# Patient Record
Sex: Female | Born: 1995
Health system: Southern US, Community
[De-identification: ages and names within clinical notes are randomized; demographics above are authoritative.]

## PROBLEM LIST (undated history)

## (undated) DIAGNOSIS — R519 Headache, unspecified: Secondary | ICD-10-CM

## (undated) HISTORY — PX: NO PAST SURGERIES: SHX2092

## (undated) HISTORY — DX: Headache, unspecified: R51.9

---
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
## 2014-01-04 ENCOUNTER — Encounter: Payer: Self-pay | Admitting: Internal Medicine

## 2014-01-04 NOTE — Progress Notes (Signed)
   Subjective:    Patient ID: Cheyenne Beck, female    DOB: [birth year], 18 y.o.   MRN: [id number]  HPI 18 year old female who presents to establish care.  For patient of Dr Tracey Harries.  She has been doing well.  In college.  Wants to be a physical therapist.  Stays active.  Plays softball.  No cardiac symptoms with increased activity or exertion.  No sob or cough.  No problems with allergies.  No nausea or vomiting.  No bowel issues.  No urinary or vaginal issues.  Not currently sexually active.  Last menstrual period 12/15/13.  Menarche in 7th - 8th grade.  Regular periods.  Has never been pregnant.  Overall feels good.     Past Surgical History  Procedure Laterality Date  . No past surgeries      Review of Systems Patient denies any headache, lightheadedness or dizziness.  No sinus or allergy symptoms.  No chest pain, tightness or palpitations.  No increased shortness of breath, cough or congestion.  No nausea or vomiting.  No acid reflux.  No abdominal pain or cramping.  No bowel change, such as diarrhea, constipation, BRBPR or melana.  No urine change.  Stays active.  Overall feels good.        Objective:   Physical Exam Filed Vitals:   12/31/13 0911  BP: 110/80  Pulse: 72  Temp: 98.1 F (77.5 C)   18 year old female in no acute distress.   HEENT:  Nares- clear.  Oropharynx - without lesions. NECK:  Supple.  Nontender.  No audible bruit.  HEART:  Appears to be regular. LUNGS:  No crackles or wheezing audible.  Respirations even and unlabored.  RADIAL PULSE:  Equal bilaterally.   ABDOMEN:  Soft, nontender.  Bowel sounds present and normal.  No audible abdominal bruit.  EXTREMITIES:  No increased edema present.  DP pulses palpable and equal bilaterally.          Assessment & Plan:  HEALTH MAINTENANCE.  Will schedule her for a yearly physical.  Hold on pap today.  Discussed the importance of wearing seatbelts and using condoms to prevent against sexually transmitted disease.     I  spent 30 minutes with the patient and more than 50% of the time was spent in consultation regarding the above.

## 2015-01-08 ENCOUNTER — Ambulatory Visit (INDEPENDENT_AMBULATORY_CARE_PROVIDER_SITE_OTHER): Payer: Managed Care, Other (non HMO) | Admitting: Internal Medicine

## 2015-01-08 ENCOUNTER — Encounter: Payer: Self-pay | Admitting: Internal Medicine

## 2015-01-08 VITALS — BP 110/70 | HR 74 | Temp 97.9°F | Ht 67.0 in | Wt 195.1 lb

## 2015-01-08 DIAGNOSIS — Z0000 Encounter for general adult medical examination without abnormal findings: Secondary | ICD-10-CM

## 2015-01-08 DIAGNOSIS — N63 Unspecified lump in unspecified breast: Secondary | ICD-10-CM

## 2015-01-08 HISTORY — DX: Unspecified lump in unspecified breast: N63.0

## 2015-01-08 NOTE — Progress Notes (Signed)
Patient ID: Cheyenne Beck, female   DOB: 08/18/1995, 19 y.o.   MRN: [id number]   Subjective:    Patient ID: Cheyenne Beck, female    DOB: 03/25/1996, 19 y.o.   MRN: [id number]  HPI  Patient here for a physical exam.  Doing well.  Stays active.  No cardiac symptoms with increased activity or exertion.  Breathing stable.  Eating and drinking well.  LMP 12/25/14.  Bowels stable.     Review of Systems  Constitutional: Negative for appetite change and unexpected weight change.  HENT: Negative for congestion and sinus pressure.   Eyes: Negative for pain and visual disturbance.  Respiratory: Negative for cough, chest tightness and shortness of breath.   Cardiovascular: Negative for chest pain, palpitations and leg swelling.  Gastrointestinal: Negative for nausea, vomiting, abdominal pain and diarrhea.  Genitourinary: Negative for dysuria and difficulty urinating.  Musculoskeletal: Negative for back pain and joint swelling.  Skin: Negative for color change and rash.  Neurological: Negative for dizziness, light-headedness and headaches.  Hematological: Negative for adenopathy. Does not bruise/bleed easily.  Psychiatric/Behavioral: Negative for dysphoric mood and agitation.       Objective:     Blood pressure recheck:  112/68  Physical Exam  Constitutional: She appears well-developed and well-nourished. No distress.  HENT:  Nose: Nose normal.  Mouth/Throat: Oropharynx is clear and moist.  Eyes: Right eye exhibits no discharge. Left eye exhibits no discharge. No scleral icterus.  Neck: Neck supple. No thyromegaly present.  Cardiovascular: Normal rate and regular rhythm.   Pulmonary/Chest: Breath sounds normal. No respiratory distress. She has no wheezes.  Breast exam reveals no nipple discharge or nipple discharged present.  Palpable nodule 11-12:00 left breast.  No other palpable nodules appreciated.  No axillary adenopathy.    Abdominal: Soft. Bowel sounds are normal. There is no  tenderness.  Musculoskeletal: She exhibits no edema or tenderness.  Lymphadenopathy:    She has no cervical adenopathy.  Skin: No rash noted. No erythema.  Psychiatric: She has a normal mood and affect. Her behavior is normal.    BP 110/70 mmHg  Pulse 74  Temp(Src) 97.9 F (36.6 C) (Oral)  Ht 5\' 7"  (1.702 m)  Wt 195 lb 2 oz (88.508 kg)  BMI 30.55 kg/m2  SpO2 98%  LMP 12/25/2014 (Approximate) Wt Readings from Last 3 Encounters:  01/08/15 195 lb 2 oz (88.508 kg)  12/31/13 198 lb 8 oz (90.039 kg) (97 %*, Z = 1.93)   * Growth percentiles are based on CDC 2-20 Years data.       Assessment & Plan:   Problem List Items Addressed This Visit    Breast nodule - Primary    Nodule as outlined.  Schedule left breast ultrasound.        Relevant Orders   US BREAST COMPLETE UNI LEFT INC AXILLA   Health care maintenance    Physical today 01/08/15.  Check breast ultrasound.            Dale DurhamSCOTT, Fed Ceci, MD

## 2015-01-08 NOTE — Progress Notes (Signed)
Pre visit review using our clinic review tool, if applicable. No additional management support is needed unless otherwise documented below in the visit note. 

## 2015-01-12 ENCOUNTER — Encounter: Payer: Self-pay | Admitting: Internal Medicine

## 2015-01-12 DIAGNOSIS — Z0000 Encounter for general adult medical examination without abnormal findings: Secondary | ICD-10-CM | POA: Insufficient documentation

## 2015-01-12 NOTE — Assessment & Plan Note (Signed)
Nodule as outlined.  Schedule left breast ultrasound.

## 2015-01-12 NOTE — Assessment & Plan Note (Signed)
Physical today 01/08/15.  Check breast ultrasound.

## 2015-01-16 ENCOUNTER — Other Ambulatory Visit: Payer: Self-pay | Admitting: Internal Medicine

## 2015-01-16 ENCOUNTER — Ambulatory Visit
Admission: RE | Admit: 2015-01-16 | Discharge: 2015-01-16 | Disposition: A | Discharge status: Home / Self Care | Payer: Managed Care, Other (non HMO) | Source: Ambulatory Visit | Attending: Internal Medicine | Admitting: Internal Medicine

## 2015-01-16 DIAGNOSIS — N63 Unspecified lump in unspecified breast: Secondary | ICD-10-CM

## 2015-01-20 ENCOUNTER — Telehealth: Payer: Self-pay | Admitting: Internal Medicine

## 2015-01-20 NOTE — Telephone Encounter (Signed)
Pt had breast ultrasound Friday.  Her mother sent me a message asking about a biopsy that radiology recommended.  I have not seen a copy of the ultrasound.  Please see if we can get from hospital. Not showing up in our system.   Thanks

## 2015-01-21 NOTE — Telephone Encounter (Signed)
Found report under "images". Printed & placed in green folder

## 2015-01-21 NOTE — Telephone Encounter (Signed)
Cheyenne Beck is away on a cruise.  Her mother sent me a my chart message asking about the biospy and if she could schedule the appt.  I have no problem with her scheduling the appt, but I would prefer to refer her to Dr Lemar Livings and let him evaluate and do biopsy if needed.  If agreeable, let me know and I will place the order for the referral.  Her mother's name is Cheyenne Beck.  I left her a message at her home and on her cell phone.

## 2015-01-22 ENCOUNTER — Other Ambulatory Visit: Payer: Self-pay | Admitting: Internal Medicine

## 2015-01-22 DIAGNOSIS — N63 Unspecified lump in unspecified breast: Secondary | ICD-10-CM

## 2015-01-22 NOTE — Telephone Encounter (Signed)
Pt's mother notified & will contact Dr. Lemar Livings office herself to schedule. Copy of Ultrasound & last office note sent electronically also.

## 2015-01-22 NOTE — Progress Notes (Unsigned)
Order placed for surgery referral.  

## 2015-01-26 ENCOUNTER — Telehealth: Payer: Self-pay | Admitting: Internal Medicine

## 2015-01-26 DIAGNOSIS — R928 Other abnormal and inconclusive findings on diagnostic imaging of breast: Secondary | ICD-10-CM

## 2015-01-26 NOTE — Telephone Encounter (Signed)
Order placed for referral to surgery for abnormal breast ultrasound.

## 2015-03-09 ENCOUNTER — Ambulatory Visit: Payer: Self-pay | Admitting: General Surgery

## 2015-09-09 IMAGING — US US BREAST COMPLETE UNI LEFT INC AXILLA
1 series · 1 of 1 positions shown · non-contrast
Comparison: None

CLINICAL DATA: Patient presents for evaluation of palpable
abnormality within the left breast upper inner quadrant anterior
depth.

EXAM:
ULTRASOUND OF THE LEFT BREAST

[Series 1: us breast complete uni left inc axilla · 0.08mm/px · 1 of 1 slices shown]
[im 1/1]
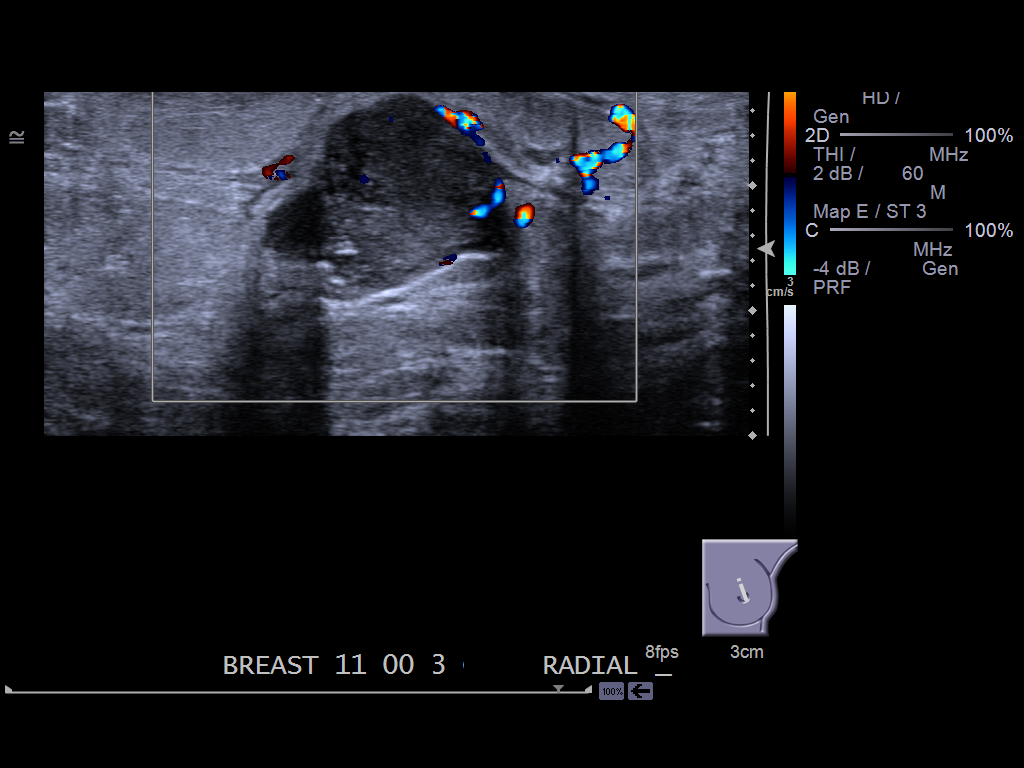

[1 of 1 positions shown; findings below may reference images not displayed]

FINDINGS: On physical exam, I palpate a small mobile mass within the 11
o'clock position left breast anterior depth.

Targeted ultrasound is performed, showing a 2.0 x 2.1 x 1.5 cm
lobular and mildly irregular hypoechoic mass with the left breast 11
o'clock position 3 cm from the nipple corresponding with palpable
abnormality. No left axillary lymphadenopathy.
IMPRESSION: Palpable left breast mass, while favored to represent a
fibroadenoma, has somewhat irregular margins and lobular contour;
therefore ultrasound-guided core needle biopsy is recommended for
definitive diagnosis.

RECOMMENDATION:
Ultrasound-guided core needle biopsy left breast mass for definitive
diagnosis.

This will be scheduled at patient convenience.

I have discussed the findings and recommendations with the patient.
Results were also provided in writing at the conclusion of the
visit. If applicable, a reminder letter will be sent to the patient
regarding the next appointment.

BI-RADS CATEGORY  4: Suspicious.

## 2016-01-11 ENCOUNTER — Ambulatory Visit (INDEPENDENT_AMBULATORY_CARE_PROVIDER_SITE_OTHER): Payer: Managed Care, Other (non HMO) | Admitting: Internal Medicine

## 2016-01-11 ENCOUNTER — Encounter: Payer: Self-pay | Admitting: Internal Medicine

## 2016-01-11 ENCOUNTER — Other Ambulatory Visit (HOSPITAL_COMMUNITY)
Admission: RE | Admit: 2016-01-11 | Discharge: 2016-01-11 | Disposition: A | Discharge status: Home / Self Care | Payer: Managed Care, Other (non HMO) | Source: Ambulatory Visit | Attending: Internal Medicine | Admitting: Internal Medicine

## 2016-01-11 VITALS — BP 110/76 | HR 66 | Temp 98.0°F | Ht 67.0 in | Wt 196.4 lb

## 2016-01-11 DIAGNOSIS — Z01419 Encounter for gynecological examination (general) (routine) without abnormal findings: Secondary | ICD-10-CM | POA: Insufficient documentation

## 2016-01-11 DIAGNOSIS — Z124 Encounter for screening for malignant neoplasm of cervix: Secondary | ICD-10-CM | POA: Diagnosis not present

## 2016-01-11 DIAGNOSIS — Z1151 Encounter for screening for human papillomavirus (HPV): Secondary | ICD-10-CM | POA: Insufficient documentation

## 2016-01-11 DIAGNOSIS — Z0000 Encounter for general adult medical examination without abnormal findings: Secondary | ICD-10-CM

## 2016-01-11 DIAGNOSIS — N63 Unspecified lump in unspecified breast: Secondary | ICD-10-CM

## 2016-01-11 NOTE — Assessment & Plan Note (Signed)
Physical today 01/11/16.

## 2016-01-11 NOTE — Addendum Note (Signed)
Addended by: Warden FillersWRIGHT, Amori Colomb S on: 01/11/2016 09:26 AM   Modules accepted: Orders, SmartSet

## 2016-01-11 NOTE — Assessment & Plan Note (Signed)
S/p removal.  Benign.  Had f/u with surgery.  No further w/up warranted.

## 2016-01-11 NOTE — Progress Notes (Signed)
Patient ID: Cheyenne Beck, female   DOB: 24-Nov-1995, 20 y.o.   MRN: [id number]   Subjective:    Patient ID: Cheyenne Beck, female    DOB: 16-Sep-1995, 20 y.o.   MRN: [id number]  HPI  Patient here for a physical exam.   She is doing well.  Working.  Staying active.  No cough or congestion.  No sob. No acid reflux reported.  No abdominal pain or cramping.  Bowels stable.  Periods regular.  LMP 12/22/15.  S/p removal of breast nodule.  Benign.  Had 6 month f/u.  Recommended no further f/u.     No past medical history on file. Past Surgical History  Procedure Laterality Date  . No past surgeries     Family History  Problem Relation Age of Onset  . Hypertension Mother   . Diabetes Mother   . Hyperlipidemia Father   . Hypertension Father   . Colon cancer Maternal Grandmother   . Arthritis Maternal Grandfather   . Kidney disease Maternal Grandfather   . Arthritis Paternal Grandmother   . Heart disease Paternal Grandmother   . Diabetes Paternal Grandmother   . Breast cancer Other    Social History   Social History  . Marital Status: Single    Spouse Name: N/A  . Number of Children: N/A  . Years of Education: N/A   Social History Main Topics  . Smoking status: Never Smoker   . Smokeless tobacco: Never Used  . Alcohol Use: No  . Drug Use: No  . Sexual Activity: Not Asked   Other Topics Concern  . None   Social History Narrative    No outpatient encounter prescriptions on file as of 01/11/2016.   No facility-administered encounter medications on file as of 01/11/2016.    Review of Systems  Constitutional: Negative for appetite change and unexpected weight change.  HENT: Negative for congestion and sinus pressure.   Eyes: Negative for pain and visual disturbance.  Respiratory: Negative for cough, chest tightness and shortness of breath.   Cardiovascular: Negative for chest pain, palpitations and leg swelling.  Gastrointestinal: Negative for nausea, vomiting, abdominal pain  and diarrhea.  Genitourinary: Negative for dysuria and difficulty urinating.  Musculoskeletal: Negative for back pain and joint swelling.  Skin: Negative for color change and rash.  Neurological: Negative for dizziness, light-headedness and headaches.  Hematological: Negative for adenopathy. Does not bruise/bleed easily.  Psychiatric/Behavioral: Negative for dysphoric mood and agitation.       Objective:    Physical Exam  Constitutional: She is oriented to person, place, and time. She appears well-developed and well-nourished. No distress.  HENT:  Nose: Nose normal.  Mouth/Throat: Oropharynx is clear and moist.  Eyes: Right eye exhibits no discharge. Left eye exhibits no discharge. No scleral icterus.  Neck: Neck supple. No thyromegaly present.  Cardiovascular: Normal rate and regular rhythm.   Pulmonary/Chest: Breath sounds normal. No accessory muscle usage. No tachypnea. No respiratory distress. She has no decreased breath sounds. She has no wheezes. She has no rhonchi. Right breast exhibits no inverted nipple, no mass, no nipple discharge and no tenderness (no axillary adenopathy). Left breast exhibits no inverted nipple, no mass, no nipple discharge and no tenderness (no axilarry adenopathy).  Abdominal: Soft. Bowel sounds are normal. There is no tenderness.  Genitourinary:  Normal external genitalia.  Vaginal vault without lesions.  Cervix identified.  Pap smear performed.  Could not appreciate any adnexal masses or tenderness.    Musculoskeletal: She exhibits no  edema or tenderness.  Lymphadenopathy:    She has no cervical adenopathy.  Neurological: She is alert and oriented to person, place, and time.  Skin: Skin is warm. No rash noted. No erythema.  Psychiatric: She has a normal mood and affect. Her behavior is normal.    BP 110/76 mmHg  Pulse 66  Temp(Src) 98 F (36.7 C) (Oral)  Ht 5\' 7"  (1.702 m)  Wt 196 lb 6.4 oz (89.086 kg)  BMI 30.75 kg/m2  SpO2 98%  LMP  12/22/2015 Wt Readings from Last 3 Encounters:  01/11/16 196 lb 6.4 oz (89.086 kg)  01/08/15 195 lb 2 oz (88.508 kg)  12/31/13 198 lb 8 oz (90.039 kg) (97 %*, Z = 1.93)   * Growth percentiles are based on CDC 2-20 Years data.    [place] Breast Ltd Uni Left Inc Axilla  01/16/2015  CLINICAL DATA:  Patient presents for evaluation of palpable abnormality within the left breast upper inner quadrant anterior depth. EXAM: ULTRASOUND OF THE LEFT BREAST COMPARISON:  None FINDINGS: On physical exam, I palpate a small mobile mass within the 11 o'clock position left breast anterior depth. Targeted ultrasound is performed, showing a 2.0 x 2.1 x 1.5 cm lobular and mildly irregular hypoechoic mass with the left breast 11 o'clock position 3 cm from the nipple corresponding with palpable abnormality. No left axillary lymphadenopathy. IMPRESSION: Palpable left breast mass, while favored to represent a fibroadenoma, has somewhat irregular margins and lobular contour; therefore ultrasound-guided core needle biopsy is recommended for definitive diagnosis. RECOMMENDATION: Ultrasound-guided core needle biopsy left breast mass for definitive diagnosis. This will be scheduled at patient convenience. I have discussed the findings and recommendations with the patient. Results were also provided in writing at the conclusion of the visit. If applicable, a reminder letter will be sent to the patient regarding the next appointment. BI-RADS CATEGORY  4: Suspicious. Electronically Signed   By: Annia Beltrew  Davis M.D.   On: 01/16/2015 10:25       Assessment & Plan:   Problem List Items Addressed This Visit    Breast nodule - Primary    S/p removal.  Benign.  Had f/u with surgery.  No further w/up warranted.        Health care maintenance    Physical today 01/11/16.            Dale DurhamSCOTT, Kuulei Kleier, MD

## 2016-01-11 NOTE — Progress Notes (Signed)
Pre visit review using our clinic review tool, if applicable. No additional management support is needed unless otherwise documented below in the visit note. 

## 2016-01-12 LAB — CYTOLOGY - PAP

## 2016-01-18 ENCOUNTER — Telehealth: Payer: Self-pay | Admitting: Internal Medicine

## 2016-01-18 NOTE — Telephone Encounter (Signed)
Patient is aware of results.

## 2016-01-18 NOTE — Addendum Note (Signed)
Addended by: Kern ReapVEREEN, Cienna Dumais B on: 01/18/2016 04:09 PM   Modules accepted: Kipp BroodSmartSet

## 2016-01-18 NOTE — Telephone Encounter (Signed)
Pt called for her lab results. Please call her at [phone].

## 2017-01-12 ENCOUNTER — Encounter: Payer: Managed Care, Other (non HMO) | Admitting: Internal Medicine

## 2017-03-16 ENCOUNTER — Ambulatory Visit (INDEPENDENT_AMBULATORY_CARE_PROVIDER_SITE_OTHER): Payer: Managed Care, Other (non HMO) | Admitting: Internal Medicine

## 2017-03-16 ENCOUNTER — Encounter: Payer: Self-pay | Admitting: Internal Medicine

## 2017-03-16 VITALS — BP 116/80 | HR 69 | Temp 99.6°F | Resp 12 | Ht 67.32 in | Wt 199.0 lb

## 2017-03-16 DIAGNOSIS — N921 Excessive and frequent menstruation with irregular cycle: Secondary | ICD-10-CM

## 2017-03-16 DIAGNOSIS — Z0000 Encounter for general adult medical examination without abnormal findings: Secondary | ICD-10-CM | POA: Diagnosis not present

## 2017-03-16 DIAGNOSIS — N920 Excessive and frequent menstruation with regular cycle: Secondary | ICD-10-CM | POA: Insufficient documentation

## 2017-03-16 LAB — POCT URINE PREGNANCY: Preg Test, Ur: NEGATIVE

## 2017-03-16 NOTE — Progress Notes (Signed)
Patient ID: Cheyenne Beck, female   DOB: 1995-08-28, 21 y.o.   MRN: [id number]   Subjective:    Patient ID: Cheyenne Beck, female    DOB: 19-Apr-1996, 21 y.o.   MRN: [id number]  HPI  Patient here for her physical exam.  Having her period now.  Has been having issues with her period.  Heavier periods and increased pain for the first couple of days of her cycle.  This has been going on for several months.   Pain has become worse.  Effects her activity.  She takes antiinflammatories.  does not help a lot.  Is interested in ocp's.  Discussed treatment options.  She desires to start oral birth control pills.  She is going to school for physical therapy.  This is going well.  Has to go back soon.  Tries to stay active.  No chest pain.  No sob.  No acid reflux.  No abdominal pain.   Bowels moving.  No clotting issues.  No h/o phlebitis. Does report occasional headaches.  States occurs intermittently - for years.  No significant headache.  No vision change or light sensitivity.  Does take excedrin migraine occasionally.  Has been evaluated previously.  No diagnosis of migraine headaches.  Overall feels well.     History reviewed. No pertinent past medical history. Past Surgical History:  Procedure Laterality Date  . NO PAST SURGERIES     Family History  Problem Relation Age of Onset  . Hypertension Mother   . Diabetes Mother   . Hyperlipidemia Father   . Hypertension Father   . Breast cancer Other   . Colon cancer Maternal Grandmother   . Arthritis Maternal Grandfather   . Kidney disease Maternal Grandfather   . Arthritis Paternal Grandmother   . Heart disease Paternal Grandmother   . Diabetes Paternal Grandmother    Social History   Social History  . Marital status: Single    Spouse name: N/A  . Number of children: N/A  . Years of education: N/A   Social History Main Topics  . Smoking status: Never Smoker  . Smokeless tobacco: Never Used  . Alcohol use No  . Drug use: No  . Sexual  activity: Not Asked   Other Topics Concern  . None   Social History Narrative  . None    Outpatient Encounter Prescriptions as of 03/16/2017  Medication Sig  . norethindrone-ethinyl estradiol (LOESTRIN FE 1/20) 1-20 MG-MCG tablet Take 1 tablet by mouth daily.   No facility-administered encounter medications on file as of 03/16/2017.     Review of Systems  Constitutional: Negative for appetite change, fatigue and unexpected weight change.  HENT: Negative for congestion and sinus pressure.   Eyes: Negative for pain and visual disturbance.  Respiratory: Negative for cough, chest tightness and shortness of breath.   Cardiovascular: Negative for chest pain, palpitations and leg swelling.  Gastrointestinal: Negative for abdominal pain, diarrhea, nausea and vomiting.  Genitourinary: Positive for menstrual problem. Negative for difficulty urinating and dysuria.  Musculoskeletal: Negative for back pain and joint swelling.  Skin: Negative for color change and rash.  Neurological: Negative for dizziness and headaches.       Reports headaches occasionally.    Hematological: Negative for adenopathy. Does not bruise/bleed easily.  Psychiatric/Behavioral: Negative for agitation and dysphoric mood.       Objective:    Physical Exam  Constitutional: She is oriented to person, place, and time. She appears well-developed and well-nourished. No distress.  HENT:  Nose: Nose normal.  Mouth/Throat: Oropharynx is clear and moist.  Eyes: Right eye exhibits no discharge. Left eye exhibits no discharge. No scleral icterus.  Neck: Neck supple. No thyromegaly present.  Cardiovascular: Normal rate and regular rhythm.   Pulmonary/Chest: Breath sounds normal. No accessory muscle usage. No tachypnea. No respiratory distress. She has no decreased breath sounds. She has no wheezes. She has no rhonchi. Right breast exhibits no inverted nipple, no mass, no nipple discharge and no tenderness (no axillary  adenopathy). Left breast exhibits no inverted nipple, no mass, no nipple discharge and no tenderness (no axilarry adenopathy).  Abdominal: Soft. Bowel sounds are normal. There is no tenderness.  Musculoskeletal: She exhibits no edema or tenderness.  Lymphadenopathy:    She has no cervical adenopathy.  Neurological: She is alert and oriented to person, place, and time.  Skin: Skin is warm. No rash noted. No erythema.  Psychiatric: She has a normal mood and affect. Her behavior is normal.    BP 116/80 (BP Location: Left Arm, Patient Position: Sitting, Cuff Size: Normal)   Pulse 69   Temp 99.6 F (37.6 C) (Oral)   Resp 12   Ht 5' 7.32" (1.71 m)   Wt 199 lb (90.3 kg)   LMP 03/10/2017   SpO2 98%   BMI 30.87 kg/m  Wt Readings from Last 3 Encounters:  03/16/17 199 lb (90.3 kg)  01/11/16 196 lb 6.4 oz (89.1 kg)  01/08/15 195 lb 2 oz (88.5 kg)       Assessment & Plan:   Problem List Items Addressed This Visit    Health care maintenance    Physical today 03/16/17.  PAP 01/2016 - negative with negative HPV.       Menorrhagia    Increased bleeding and cramping with her menstrual cycle as outlined. Discussed at length with her today.  Feels needs somethimg to help with the symptoms.  Discussed treatment options.  She is interested in starting ocp's.   Discussed ocp's.  Discussed possible side effects and risk of the medication.  No h/o blood clots or phlebitis.  Occasional headaches as outlined.  Check urine pregnancy test.  If negative have her start loestrin as outlined.  Monitor blood pressure.  Monitor for any change in headaches.        Relevant Orders   POCT urine pregnancy (Completed)       Dale DurhamSCOTT, Nadiyah Zeis, MD

## 2017-03-16 NOTE — Assessment & Plan Note (Signed)
Physical today 03/16/17.  PAP 01/2016 - negative with negative HPV.

## 2017-03-17 ENCOUNTER — Encounter: Payer: Self-pay | Admitting: Internal Medicine

## 2017-03-19 ENCOUNTER — Telehealth: Payer: Self-pay | Admitting: Internal Medicine

## 2017-03-19 ENCOUNTER — Encounter: Payer: Self-pay | Admitting: Internal Medicine

## 2017-03-19 MED ORDER — NORETHIN ACE-ETH ESTRAD-FE 1-20 MG-MCG PO TABS: 1.0000 | ORAL_TABLET | Freq: Every day | ORAL | 11 refills | Status: DC

## 2017-03-19 NOTE — Telephone Encounter (Signed)
My chart message sent to pt notifying prescription sent in.

## 2017-03-19 NOTE — Assessment & Plan Note (Signed)
Increased bleeding and cramping with her menstrual cycle as outlined. Discussed at length with her today.  Feels needs somethimg to help with the symptoms.  Discussed treatment options.  She is interested in starting ocp's.   Discussed ocp's.  Discussed possible side effects and risk of the medication.  No h/o blood clots or phlebitis.  Occasional headaches as outlined.  Check urine pregnancy test.  If negative have her start loestrin as outlined.  Monitor blood pressure.  Monitor for any change in headaches.

## 2017-03-24 ENCOUNTER — Telehealth: Payer: Self-pay | Admitting: Internal Medicine

## 2017-03-24 ENCOUNTER — Encounter: Payer: Managed Care, Other (non HMO) | Admitting: Internal Medicine

## 2017-03-24 NOTE — Telephone Encounter (Signed)
Patient is aware that pregnancy test is negative.

## 2017-03-24 NOTE — Telephone Encounter (Signed)
Pt called back returning your call. Please advise, thank you!  Call pt @ [phone]

## 2017-03-29 ENCOUNTER — Encounter: Payer: Self-pay | Admitting: Internal Medicine

## 2017-03-30 NOTE — Telephone Encounter (Signed)
Patient needs letter for school stating she has had her CPE and that she is able to perform duties for clinicals, patient also say we were getting her Immunization records?

## 2017-03-31 ENCOUNTER — Encounter: Payer: Self-pay | Admitting: Internal Medicine

## 2017-03-31 NOTE — Telephone Encounter (Signed)
I have typed a letter.  Tried to print from home, but I am not sure it printed.  May need to reprint.  Regarding her immunizations, I looked under media.  There are records from her pediatrician that have been scanned in to the system (labeled [facility] immunizations - 03/2014).  I scrolled through and there is a copy of her immunizations but it is hard to read.  I am not sure if this can be printed to sign or if this needs to be requested again from her pediatrician - to have signed).

## 2017-03-31 NOTE — Telephone Encounter (Signed)
Printed letter and I was able to print the immunizations off [facility], I have everything ready.

## 2017-04-01 NOTE — Telephone Encounter (Signed)
Late entry.  I signed and gave back to you to notify pt.  Thanks

## 2017-05-01 ENCOUNTER — Encounter: Payer: Self-pay | Admitting: Internal Medicine

## 2017-05-15 ENCOUNTER — Ambulatory Visit: Payer: Managed Care, Other (non HMO) | Admitting: Internal Medicine

## 2017-07-24 ENCOUNTER — Ambulatory Visit (INDEPENDENT_AMBULATORY_CARE_PROVIDER_SITE_OTHER): Payer: Managed Care, Other (non HMO) | Admitting: Internal Medicine

## 2017-07-24 ENCOUNTER — Encounter: Payer: Self-pay | Admitting: Internal Medicine

## 2017-07-24 DIAGNOSIS — N921 Excessive and frequent menstruation with irregular cycle: Secondary | ICD-10-CM | POA: Diagnosis not present

## 2017-07-24 MED ORDER — NORETHIN ACE-ETH ESTRAD-FE 1-20 MG-MCG PO TABS: 1.0000 | ORAL_TABLET | Freq: Every day | ORAL | 3 refills | Status: DC

## 2017-07-24 NOTE — Progress Notes (Signed)
Pre visit review using our clinic review tool, if applicable. No additional management support is needed unless otherwise documented below in the visit note. 

## 2017-07-24 NOTE — Progress Notes (Signed)
Patient ID: Cheyenne Beck, female   DOB: 08/18/1995, 21 y.o.   MRN: [id number]   Subjective:    Patient ID: Cheyenne Beck, female    DOB: 03/22/1996, 21 y.o.   MRN: [id number]  HPI  Patient here for a scheduled follow up.  Started on ocp's last visit.  Here to f/u on her blood pressure.  States she is doing well.  Out for Christmas break.  Handling stress. Trying to stay active.  No chest pain.  Breathing stable.  No acid reflux.  No abdominal pain.  Periods regular.  Cramps better.  Doing well with ocp's.  Blood pressure ok.     History reviewed. No pertinent past medical history. Past Surgical History:  Procedure Laterality Date  . NO PAST SURGERIES     Family History  Problem Relation Age of Onset  . Hypertension Mother   . Diabetes Mother   . Hyperlipidemia Father   . Hypertension Father   . Breast cancer Other   . Colon cancer Maternal Grandmother   . Arthritis Maternal Grandfather   . Kidney disease Maternal Grandfather   . Arthritis Paternal Grandmother   . Heart disease Paternal Grandmother   . Diabetes Paternal Grandmother    Social History   Socioeconomic History  . Marital status: Single    Spouse name: None  . Number of children: None  . Years of education: None  . Highest education level: None  Social Needs  . Financial resource strain: None  . Food insecurity - worry: None  . Food insecurity - inability: None  . Transportation needs - medical: None  . Transportation needs - non-medical: None  Occupational History  . None  Tobacco Use  . Smoking status: Never Smoker  . Smokeless tobacco: Never Used  Substance and Sexual Activity  . Alcohol use: No    Alcohol/week: 0.0 oz  . Drug use: No  . Sexual activity: None  Other Topics Concern  . None  Social History Narrative  . None    Outpatient Encounter Medications as of 07/24/2017  Medication Sig  . norethindrone-ethinyl estradiol (LOESTRIN FE 1/20) 1-20 MG-MCG tablet Take 1 tablet by mouth daily.    . [DISCONTINUED] norethindrone-ethinyl estradiol (LOESTRIN FE 1/20) 1-20 MG-MCG tablet Take 1 tablet by mouth daily.   No facility-administered encounter medications on file as of 07/24/2017.     Review of Systems  Constitutional: Negative for appetite change and unexpected weight change.  HENT: Negative for congestion and sinus pressure.   Respiratory: Negative for cough, chest tightness and shortness of breath.   Cardiovascular: Negative for chest pain, palpitations and leg swelling.  Gastrointestinal: Negative for abdominal pain, diarrhea, nausea and vomiting.  Genitourinary:       Periods regular.    Musculoskeletal: Negative for joint swelling and myalgias.  Skin: Negative for color change and rash.  Neurological: Negative for dizziness, light-headedness and headaches.  Psychiatric/Behavioral: Negative for agitation and dysphoric mood.       Objective:     Blood pressure rechecked by me:  124/78  Physical Exam  Constitutional: She appears well-developed and well-nourished. No distress.  HENT:  Nose: Nose normal.  Mouth/Throat: Oropharynx is clear and moist.  Neck: Neck supple. No thyromegaly present.  Cardiovascular: Normal rate and regular rhythm.  Pulmonary/Chest: Breath sounds normal. No respiratory distress. She has no wheezes.  Abdominal: Soft. Bowel sounds are normal. There is no tenderness.  Musculoskeletal: She exhibits no edema or tenderness.  Lymphadenopathy:    She  has no cervical adenopathy.  Skin: No rash noted. No erythema.  Psychiatric: She has a normal mood and affect. Her behavior is normal.    BP 126/78   Pulse 75   Temp 98.1 F (36.7 C) (Oral)   Wt 204 lb (92.5 kg)   SpO2 99%   BMI 31.65 kg/m  Wt Readings from Last 3 Encounters:  07/24/17 204 lb (92.5 kg)  03/16/17 199 lb (90.3 kg)  01/11/16 196 lb 6.4 oz (89.1 kg)       Assessment & Plan:   Problem List Items Addressed This Visit    Menorrhagia    Doing well on her current ocp's.   Blood pressure doing well.  Follow.  Notify me if any problems.            Dale DurhamSCOTT, Deepak Bless, MD

## 2017-07-27 NOTE — Assessment & Plan Note (Signed)
Doing well on her current ocp's.  Blood pressure doing well.  Follow.  Notify me if any problems.

## 2017-12-13 ENCOUNTER — Encounter: Payer: Self-pay | Admitting: Internal Medicine

## 2017-12-15 MED ORDER — NORETHINDRONE ACET-ETHINYL EST 1.5-30 MG-MCG PO TABS: 1.0000 | ORAL_TABLET | Freq: Every day | ORAL | 5 refills | Status: DC

## 2017-12-15 NOTE — Telephone Encounter (Signed)
rx sent in for loesterin 1.5/30

## 2018-03-19 ENCOUNTER — Encounter: Payer: Self-pay | Admitting: Internal Medicine

## 2018-03-19 ENCOUNTER — Ambulatory Visit (INDEPENDENT_AMBULATORY_CARE_PROVIDER_SITE_OTHER): Payer: Managed Care, Other (non HMO) | Admitting: Internal Medicine

## 2018-03-19 VITALS — BP 122/70 | HR 81 | Temp 98.0°F | Resp 18 | Ht 67.0 in | Wt 200.8 lb

## 2018-03-19 DIAGNOSIS — Z0000 Encounter for general adult medical examination without abnormal findings: Secondary | ICD-10-CM

## 2018-03-19 DIAGNOSIS — N921 Excessive and frequent menstruation with irregular cycle: Secondary | ICD-10-CM | POA: Diagnosis not present

## 2018-03-19 MED ORDER — NORETHINDRONE ACET-ETHINYL EST 1.5-30 MG-MCG PO TABS: 1.0000 | ORAL_TABLET | Freq: Every day | ORAL | 3 refills | Status: DC

## 2018-03-19 NOTE — Progress Notes (Signed)
Patient ID: Elissa LovettAlayna K Allred, female   DOB: 03/31/1996, 22 y.o.   MRN: [id number]   Subjective:    Patient ID: Elissa LovettAlayna K Allred, female    DOB: 05/28/1996, 22 y.o.   MRN: [id number]  HPI  Patient here for her physical.  She reports she is doing well.  Just returned from a trip out of the country.  Doing well.  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  Trying to stay active.  Breathing stable.  No acid reflux reported.  No abdominal pain.  Bowels moving.  Periods regular.  Doing well with current ocp's.     History reviewed. No pertinent past medical history. Past Surgical History:  Procedure Laterality Date  . NO PAST SURGERIES     Family History  Problem Relation Age of Onset  . Hypertension Mother   . Diabetes Mother   . Hyperlipidemia Father   . Hypertension Father   . Breast cancer Other   . Colon cancer Maternal Grandmother   . Arthritis Maternal Grandfather   . Kidney disease Maternal Grandfather   . Arthritis Paternal Grandmother   . Heart disease Paternal Grandmother   . Diabetes Paternal Grandmother    Social History   Socioeconomic History  . Marital status: Single    Spouse name: Not on file  . Number of children: Not on file  . Years of education: Not on file  . Highest education level: Not on file  Occupational History  . Not on file  Social Needs  . Financial resource strain: Not on file  . Food insecurity:    Worry: Not on file    Inability: Not on file  . Transportation needs:    Medical: Not on file    Non-medical: Not on file  Tobacco Use  . Smoking status: Never Smoker  . Smokeless tobacco: Never Used  Substance and Sexual Activity  . Alcohol use: No    Alcohol/week: 0.0 standard drinks  . Drug use: No  . Sexual activity: Not on file  Lifestyle  . Physical activity:    Days per week: Not on file    Minutes per session: Not on file  . Stress: Not on file  Relationships  . Social connections:    Talks on phone: Not on file      Gets together: Not on file    Attends religious service: Not on file    Active member of club or organization: Not on file    Attends meetings of clubs or organizations: Not on file    Relationship status: Not on file  Other Topics Concern  . Not on file  Social History Narrative  . Not on file    Outpatient Encounter Medications as of 03/19/2018  Medication Sig  . Norethindrone Acetate-Ethinyl Estradiol (LOESTRIN 1.5/30, 21,) 1.5-30 MG-MCG tablet Take 1 tablet by mouth daily.  . [DISCONTINUED] Norethindrone Acetate-Ethinyl Estradiol (LOESTRIN 1.5/30, 21,) 1.5-30 MG-MCG tablet Take 1 tablet by mouth daily.   No facility-administered encounter medications on file as of 03/19/2018.     Review of Systems  Constitutional: Negative for appetite change and unexpected weight change.  HENT: Negative for congestion and sinus pressure.   Eyes: Negative for pain and visual disturbance.  Respiratory: Negative for cough, chest tightness and shortness of breath.   Cardiovascular: Negative for chest pain, palpitations and leg swelling.  Gastrointestinal: Negative for abdominal pain, diarrhea, nausea and vomiting.  Genitourinary: Negative for difficulty urinating and dysuria.  Musculoskeletal: Negative  for joint swelling and myalgias.  Skin: Negative for color change and rash.  Neurological: Negative for dizziness, light-headedness and headaches.  Hematological: Negative for adenopathy. Does not bruise/bleed easily.  Psychiatric/Behavioral: Negative for agitation and dysphoric mood.      Objective:    Physical Exam  Constitutional: She is oriented to person, place, and time. She appears well-developed and well-nourished. No distress.  HENT:  Nose: Nose normal.  Mouth/Throat: Oropharynx is clear and moist.  Eyes: Right eye exhibits no discharge. Left eye exhibits no discharge. No scleral icterus.  Neck: Neck supple. No thyromegaly present.  Cardiovascular: Normal rate and regular rhythm.   Pulmonary/Chest: Breath sounds normal. No accessory muscle usage. No tachypnea. No respiratory distress. She has no decreased breath sounds. She has no wheezes. She has no rhonchi. Right breast exhibits no inverted nipple, no mass, no nipple discharge and no tenderness (no axillary adenopathy). Left breast exhibits no inverted nipple, no mass, no nipple discharge and no tenderness (no axilarry adenopathy).  Abdominal: Soft. Bowel sounds are normal. There is no tenderness.  Musculoskeletal: She exhibits no edema or tenderness.  Lymphadenopathy:    She has no cervical adenopathy.  Neurological: She is alert and oriented to person, place, and time.  Skin: No rash noted. No erythema.  Psychiatric: She has a normal mood and affect. Her behavior is normal.    BP 122/70 (BP Location: Left Arm, Patient Position: Sitting, Cuff Size: Normal)   Pulse 81   Temp 98 F (36.7 C) (Oral)   Resp 18   Ht 5\' 7"  (1.702 m)   Wt 200 lb 12.8 oz (91.1 kg)   SpO2 98%   BMI 31.45 kg/m  Wt Readings from Last 3 Encounters:  03/19/18 200 lb 12.8 oz (91.1 kg)  07/24/17 204 lb (92.5 kg)  03/16/17 199 lb (90.3 kg)       Assessment & Plan:   Problem List Items Addressed This Visit    Health care maintenance    Physical today 03/19/18.  PAP 01/2016 - negative with negative HPV.        Menorrhagia    Doing well on current ocp's.  Follow.         Other Visit Diagnoses    Routine general medical examination at a health care facility    -  Primary       Dale Durhamharlene Zayra Devito, MD

## 2018-03-19 NOTE — Assessment & Plan Note (Signed)
Physical today 03/19/18.  PAP 01/2016 - negative with negative HPV.

## 2018-03-21 ENCOUNTER — Encounter: Payer: Self-pay | Admitting: Internal Medicine

## 2018-03-21 NOTE — Assessment & Plan Note (Signed)
Doing well on current ocp's.  Follow.  

## 2018-12-15 ENCOUNTER — Other Ambulatory Visit: Payer: Self-pay | Admitting: Internal Medicine

## 2019-03-22 ENCOUNTER — Encounter: Payer: Managed Care, Other (non HMO) | Admitting: Internal Medicine

## 2019-04-23 DIAGNOSIS — Z23 Encounter for immunization: Secondary | ICD-10-CM | POA: Diagnosis not present

## 2019-06-17 DIAGNOSIS — Z111 Encounter for screening for respiratory tuberculosis: Secondary | ICD-10-CM | POA: Diagnosis not present

## 2019-07-15 DIAGNOSIS — Z20828 Contact with and (suspected) exposure to other viral communicable diseases: Secondary | ICD-10-CM | POA: Diagnosis not present

## 2019-07-30 ENCOUNTER — Encounter: Payer: Self-pay | Admitting: Internal Medicine

## 2019-07-30 ENCOUNTER — Other Ambulatory Visit: Payer: Self-pay

## 2019-07-30 ENCOUNTER — Ambulatory Visit (INDEPENDENT_AMBULATORY_CARE_PROVIDER_SITE_OTHER): Payer: 59 | Admitting: Internal Medicine

## 2019-07-30 DIAGNOSIS — R05 Cough: Secondary | ICD-10-CM

## 2019-07-30 DIAGNOSIS — R059 Cough, unspecified: Secondary | ICD-10-CM | POA: Insufficient documentation

## 2019-07-30 DIAGNOSIS — N921 Excessive and frequent menstruation with irregular cycle: Secondary | ICD-10-CM | POA: Diagnosis not present

## 2019-07-30 NOTE — Assessment & Plan Note (Signed)
Doing well on her current ocp's. Periods regular.  No spotting.  Follow.

## 2019-07-30 NOTE — Assessment & Plan Note (Signed)
Recently diagnosed with covid.  Doing better.  Minimal cough and fatigue, but feels like she is getting back to normal.  Follow.

## 2019-07-30 NOTE — Progress Notes (Signed)
Patient ID: Cheyenne Beck, female   DOB: [birth year], 23 y.o.   MRN: [id number]   Virtual Visit via video Note  This visit type was conducted due to national recommendations for restrictions regarding the COVID-19 pandemic (e.g. social distancing).  This format is felt to be most appropriate for this patient at this time.  All issues noted in this document were discussed and addressed.  No physical exam was performed (except for noted visual exam findings with Video Visits).   I connected with Cheyenne Beck by a video enabled telemedicine application and verified that I am speaking with the correct person using two identifiers. Location patient: home Location provider: work Persons participating in the virtual visit: patient, provider  I discussed the limitations, risks, security and privacy concerns of performing an evaluation and management service by video and the availability of in person appointments.  The patient expressed understanding and agreed to proceed.   Reason for visit:  Scheduled for a physical.  Did f/u virtually due to covid restrictions.    HPI: She reports she is doing well.  Recently diagnosed with covid.  Better.  Never had sob.  Feels like she is getting back to her normal.  Some minimal residual cough, but overall improved.  Eating well.  Previously lost her sense of taste, but this is back.  No nausea or vomiting reported.  No bowel change reported.  On ocp's.  Periods regular.  No spotting in between.  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  School going well.  Some increased stress, but overall handling things well.     ROS: See pertinent positives and negatives per HPI.  History reviewed. No pertinent past medical history.  Past Surgical History:  Procedure Laterality Date  . NO PAST SURGERIES      Family History  Problem Relation Age of Onset  . Hypertension Mother   . Diabetes Mother   . Hyperlipidemia Father   . Hypertension Father    . Breast cancer Other   . Colon cancer Maternal Grandmother   . Arthritis Maternal Grandfather   . Kidney disease Maternal Grandfather   . Arthritis Paternal Grandmother   . Heart disease Paternal Grandmother   . Diabetes Paternal Grandmother     SOCIAL HX: reviewed.    Current Outpatient Medications:  .  JUNEL 1.5/30 1.5-30 MG-MCG tablet, TAKE 1 TABLET BY MOUTH EVERY DAY, Disp: 63 tablet, Rfl: 3  EXAM:  GENERAL: alert, oriented, appears well and in no acute distress  HEENT: atraumatic, conjunttiva clear, no obvious abnormalities on inspection of external nose and ears  NECK: normal movements of the head and neck  LUNGS: on inspection no signs of respiratory distress, breathing rate appears normal, no obvious gross SOB, gasping or wheezing  CV: no obvious cyanosis  PSYCH/NEURO: pleasant and cooperative, no obvious depression or anxiety, speech and thought processing grossly intact  ASSESSMENT AND PLAN:  Discussed the following assessment and plan:  Menorrhagia Doing well on her current ocp's. Periods regular.  No spotting.  Follow.    Cough Recently diagnosed with covid.  Doing better.  Minimal cough and fatigue, but feels like she is getting back to normal.  Follow.      I discussed the assessment and treatment plan with the patient. The patient was provided an opportunity to ask questions and all were answered. The patient agreed with the plan and demonstrated an understanding of the instructions.   The patient was advised to call back  or seek an in-person evaluation if the symptoms worsen or if the condition fails to improve as anticipated.   Einar Pheasant, MD

## 2019-10-28 ENCOUNTER — Encounter: Payer: Self-pay | Admitting: Internal Medicine

## 2019-10-29 NOTE — Telephone Encounter (Signed)
I can't do anything with this, the coders will review the provider's note and respond accordingly.

## 2019-11-01 NOTE — Telephone Encounter (Signed)
Noted  

## 2019-11-19 ENCOUNTER — Encounter: Payer: Self-pay | Admitting: Internal Medicine

## 2019-11-20 NOTE — Telephone Encounter (Signed)
I sent pt a my chart message.  See message.  Please contact pt and see if any acute symptoms.  If more acute symptoms - needs to be evaluated.  Will need appt scheduled with me.  See me if questions.

## 2019-11-20 NOTE — Telephone Encounter (Signed)
Second attempt to reach patient.

## 2019-11-20 NOTE — Telephone Encounter (Signed)
Please schedule pt an appt for next week for evaluation.  Let her know that if any acute symptoms, will need to be seen earlier.

## 2019-11-20 NOTE — Telephone Encounter (Signed)
Tried reach patient by phone for further triage no answer left message to call office.

## 2019-11-21 NOTE — Telephone Encounter (Signed)
Pt called to back.. I scheduled her for 4/21 Please call pt back at [phone]

## 2019-11-21 NOTE — Telephone Encounter (Signed)
Left message for patient to return my call.

## 2019-11-27 ENCOUNTER — Other Ambulatory Visit: Payer: Self-pay

## 2019-11-27 ENCOUNTER — Encounter: Payer: Self-pay | Admitting: Internal Medicine

## 2019-11-27 ENCOUNTER — Ambulatory Visit (INDEPENDENT_AMBULATORY_CARE_PROVIDER_SITE_OTHER): Payer: 59 | Admitting: Internal Medicine

## 2019-11-27 DIAGNOSIS — R002 Palpitations: Secondary | ICD-10-CM | POA: Diagnosis not present

## 2019-11-27 LAB — COMPREHENSIVE METABOLIC PANEL
ALT: 12 U/L (ref 0–35)
AST: 14 U/L (ref 0–37)
Albumin: 4.3 g/dL (ref 3.5–5.2)
Alkaline Phosphatase: 35 U/L — ABNORMAL LOW (ref 39–117)
BUN: 13 mg/dL (ref 6–23)
CO2: 26 mEq/L (ref 19–32)
Calcium: 9.1 mg/dL (ref 8.4–10.5)
Chloride: 106 mEq/L (ref 96–112)
Creatinine, Ser: 0.69 mg/dL (ref 0.40–1.20)
GFR: 103.42 mL/min (ref >60.00)
Glucose, Bld: 91 mg/dL (ref 70–99)
Potassium: 3.6 mEq/L (ref 3.5–5.1)
Sodium: 139 mEq/L (ref 135–145)
Total Bilirubin: 0.5 mg/dL (ref 0.2–1.2)
Total Protein: 7.3 g/dL (ref 6.0–8.3)

## 2019-11-27 LAB — TSH: TSH: 1.76 u[IU]/mL (ref 0.35–4.50)

## 2019-11-27 NOTE — Progress Notes (Signed)
Patient ID: Cheyenne Beck, female   DOB: [birth year], 24 y.o.   MRN: [id number]   Subjective:    Patient ID: Cheyenne Beck, female    DOB: April 20, 1996, 24 y.o.   MRN: [id number]  HPI This visit occurred during the SARS-CoV-2 public health emergency.  Safety protocols were in place, including screening questions prior to the visit, additional usage of staff PPE, and extensive cleaning of exam room while observing appropriate contact time as indicated for disinfecting solutions.  Patient here as a work in appt with concerns regarding palpitations.  Diagnosed with covid 07/2019.  No residual problems from covid.  Has noticed occasional palpitations previously.  Recently - over the last 7-10 days, noticed increased episodes.  States noticed what felt like heart skipping.  No increased heart rate.  Over the next several days - noticed continued episodes.  Last two days - some better.  Noticed  - last night and has not noticed today.  No chest pain.  When walked 2 miles - noticed minimal tightness (chest).  No other chest rightness or chest pain.  No sob.  No increased cough or congestion.  Eating.  No nausea or vomiting.  Blood pressure 120/80.   History reviewed. No pertinent past medical history. Past Surgical History:  Procedure Laterality Date  . NO PAST SURGERIES     Family History  Problem Relation Age of Onset  . Hypertension Mother   . Diabetes Mother   . Hyperlipidemia Father   . Hypertension Father   . Breast cancer Other   . Colon cancer Maternal Grandmother   . Arthritis Maternal Grandfather   . Kidney disease Maternal Grandfather   . Arthritis Paternal Grandmother   . Heart disease Paternal Grandmother   . Diabetes Paternal Grandmother    Social History   Socioeconomic History  . Marital status: Single    Spouse name: Not on file  . Number of children: Not on file  . Years of education: Not on file  . Highest education level: Not on file  Occupational History  . Not on  file  Tobacco Use  . Smoking status: Never Smoker  . Smokeless tobacco: Never Used  Substance and Sexual Activity  . Alcohol use: No    Alcohol/week: 0.0 standard drinks  . Drug use: No  . Sexual activity: Not on file  Other Topics Concern  . Not on file  Social History Narrative  . Not on file   Social Determinants of Health   Financial Resource Strain:   . Difficulty of Paying Living Expenses:   Food Insecurity:   . Worried About Charity fundraiser in the Last Year:   . Arboriculturist in the Last Year:   Transportation Needs:   . Film/video editor (Medical):   Marland Kitchen Lack of Transportation (Non-Medical):   Physical Activity:   . Days of Exercise per Week:   . Minutes of Exercise per Session:   Stress:   . Feeling of Stress :   Social Connections:   . Frequency of Communication with Friends and Family:   . Frequency of Social Gatherings with Friends and Family:   . Attends Religious Services:   . Active Member of Clubs or Organizations:   . Attends Archivist Meetings:   Marland Kitchen Marital Status:     Outpatient Encounter Medications as of 11/27/2019  Medication Sig  . JUNEL 1.5/30 1.5-30 MG-MCG tablet TAKE 1 TABLET BY MOUTH EVERY DAY   No facility-administered  encounter medications on file as of 11/27/2019.    Review of Systems  Constitutional: Negative for appetite change and unexpected weight change.  HENT: Negative for congestion and sinus pressure.   Respiratory: Negative for cough.        No sob.  Palpitations as outlined.    Cardiovascular: Negative for chest pain and leg swelling.  Gastrointestinal: Negative for abdominal pain, diarrhea, nausea and vomiting.  Musculoskeletal: Negative for joint swelling and myalgias.  Skin: Negative for color change and rash.  Neurological: Negative for dizziness, light-headedness and headaches.  Psychiatric/Behavioral: Negative for agitation and dysphoric mood.       Objective:    Physical Exam Vitals reviewed.   Constitutional:      General: She is not in acute distress.    Appearance: Normal appearance.  HENT:     Head: Normocephalic and atraumatic.     Right Ear: External ear normal.     Left Ear: External ear normal.  Neck:     Thyroid: No thyromegaly.  Cardiovascular:     Rate and Rhythm: Normal rate and regular rhythm.  Pulmonary:     Effort: No respiratory distress.     Breath sounds: Normal breath sounds. No wheezing.  Abdominal:     General: Bowel sounds are normal.     Palpations: Abdomen is soft.     Tenderness: There is no abdominal tenderness.  Musculoskeletal:        General: No swelling or tenderness.     Cervical back: Neck supple. No tenderness.  Lymphadenopathy:     Cervical: No cervical adenopathy.  Skin:    Findings: No erythema or rash.  Neurological:     Mental Status: She is alert.  Psychiatric:        Mood and Affect: Mood normal.        Behavior: Behavior normal.     BP 122/80   Pulse 75   Temp 97.9 F (36.6 C)   Resp 16   Ht 5' 7"  (1.702 m)   Wt 194 lb (88 kg)   SpO2 99%   BMI 30.38 kg/m  Wt Readings from Last 3 Encounters:  11/27/19 194 lb (88 kg)  07/30/19 200 lb (90.7 kg)  03/19/18 200 lb 12.8 oz (91.1 kg)       Assessment & Plan:   Problem List Items Addressed This Visit    Palpitations    Palpitations as outlined.  Increased recently.  EKG - SR, no acute ischemic changes.  Check cbc, met c and tsh.  Will have cardiology evaluate with question of need for further cardiac w/up - including monitor, etc.        Relevant Orders   CBC with Differential/Platelet   Comprehensive metabolic panel   TSH   Ambulatory referral to Cardiology       Einar Pheasant, MD

## 2019-11-27 NOTE — Assessment & Plan Note (Signed)
Palpitations as outlined.  Increased recently.  EKG - SR, no acute ischemic changes.  Check cbc, met c and tsh.  Will have cardiology evaluate with question of need for further cardiac w/up - including monitor, etc.

## 2019-11-28 LAB — CBC WITH DIFFERENTIAL/PLATELET
Basophils Absolute: 0 10*3/uL (ref 0.0–0.1)
Basophils Relative: 0.8 % (ref 0.0–3.0)
Eosinophils Absolute: 0.1 10*3/uL (ref 0.0–0.7)
Eosinophils Relative: 1.5 % (ref 0.0–5.0)
HCT: 42.7 % (ref 36.0–46.0)
Hemoglobin: 14.5 g/dL (ref 12.0–15.0)
Lymphocytes Relative: 29.7 % (ref 12.0–46.0)
Lymphs Abs: 1.7 10*3/uL (ref 0.7–4.0)
MCHC: 34 g/dL (ref 30.0–36.0)
MCV: 87.2 fl (ref 78.0–100.0)
Monocytes Absolute: 0.3 10*3/uL (ref 0.1–1.0)
Monocytes Relative: 4.7 % (ref 3.0–12.0)
Neutro Abs: 3.6 10*3/uL (ref 1.4–7.7)
Neutrophils Relative %: 63.3 % (ref 43.0–77.0)
Platelets: 186 10*3/uL (ref 150.0–400.0)
RBC: 4.9 Mil/uL (ref 3.87–5.11)
RDW: 12.8 % (ref 11.5–15.5)
WBC: 5.7 10*3/uL (ref 4.0–10.5)

## 2019-11-29 ENCOUNTER — Encounter: Payer: Self-pay | Admitting: Internal Medicine

## 2020-01-04 ENCOUNTER — Other Ambulatory Visit: Payer: Self-pay | Admitting: Internal Medicine

## 2020-08-04 ENCOUNTER — Encounter: Payer: 59 | Admitting: Internal Medicine

## 2020-08-18 ENCOUNTER — Other Ambulatory Visit: Payer: Self-pay

## 2020-08-18 ENCOUNTER — Encounter: Payer: Self-pay | Admitting: Internal Medicine

## 2020-08-18 ENCOUNTER — Ambulatory Visit (INDEPENDENT_AMBULATORY_CARE_PROVIDER_SITE_OTHER): Payer: 59 | Admitting: Internal Medicine

## 2020-08-18 VITALS — BP 110/82 | HR 78 | Temp 98.4°F | Ht 67.0 in | Wt 203.0 lb

## 2020-08-18 DIAGNOSIS — R002 Palpitations: Secondary | ICD-10-CM | POA: Diagnosis not present

## 2020-08-18 DIAGNOSIS — Z0000 Encounter for general adult medical examination without abnormal findings: Secondary | ICD-10-CM | POA: Diagnosis not present

## 2020-08-18 NOTE — Progress Notes (Signed)
Patient ID: Cheyenne Beck, female   DOB: 10/06/1995, 25 y.o.   MRN: [id number]   Subjective:    Patient ID: Cheyenne Beck, female    DOB: 1996/01/28, 25 y.o.   MRN: [id number]  HPI This visit occurred during the SARS-CoV-2 public health emergency.  Safety protocols were in place, including screening questions prior to the visit, additional usage of staff PPE, and extensive cleaning of exam room while observing appropriate contact time as indicated for disinfecting solutions.  Patient here for her physical exam.  Diagnosed with covid 07/2019.  Was previously noticing palpitations.  See last note for details.  Trying to stay active.  No chest pain or sob reported.  No abdominal pain or bowel change reported.  No acid reflux or abdominal pain reported.  Bowels moving.  PAP 2018.  Will do next visit.   History reviewed. No pertinent past medical history. Past Surgical History:  Procedure Laterality Date  . NO PAST SURGERIES     Family History  Problem Relation Age of Onset  . Hypertension Mother   . Diabetes Mother   . Hyperlipidemia Father   . Hypertension Father   . Breast cancer Other   . Colon cancer Maternal Grandmother   . Arthritis Maternal Grandfather   . Kidney disease Maternal Grandfather   . Arthritis Paternal Grandmother   . Heart disease Paternal Grandmother   . Diabetes Paternal Grandmother    Social History   Socioeconomic History  . Marital status: Single    Spouse name: Not on file  . Number of children: Not on file  . Years of education: Not on file  . Highest education level: Not on file  Occupational History  . Not on file  Tobacco Use  . Smoking status: Never Smoker  . Smokeless tobacco: Never Used  Substance and Sexual Activity  . Alcohol use: No    Alcohol/week: 0.0 standard drinks  . Drug use: No  . Sexual activity: Not on file  Other Topics Concern  . Not on file  Social History Narrative  . Not on file   Social Determinants of Health    Financial Resource Strain: Not on file  Food Insecurity: Not on file  Transportation Needs: Not on file  Physical Activity: Not on file  Stress: Not on file  Social Connections: Not on file    Outpatient Encounter Medications as of 08/18/2020  Medication Sig  . JUNEL 1.5/30 1.5-30 MG-MCG tablet TAKE 1 TABLET BY MOUTH EVERY DAY   No facility-administered encounter medications on file as of 08/18/2020.    Review of Systems  Constitutional: Negative for appetite change and unexpected weight change.  HENT: Negative for congestion, sinus pressure and sore throat.   Eyes: Negative for pain and visual disturbance.  Respiratory: Negative for cough, chest tightness and shortness of breath.   Cardiovascular: Negative for chest pain and leg swelling.  Gastrointestinal: Negative for abdominal pain, diarrhea, nausea and vomiting.  Genitourinary: Negative for difficulty urinating and dysuria.  Musculoskeletal: Negative for joint swelling and myalgias.  Skin: Negative for color change and rash.  Neurological: Negative for dizziness, light-headedness and headaches.  Hematological: Negative for adenopathy. Does not bruise/bleed easily.  Psychiatric/Behavioral: Negative for agitation and dysphoric mood.       Objective:    Physical Exam Vitals reviewed.  Constitutional:      General: She is not in acute distress.    Appearance: Normal appearance. She is well-developed and well-nourished.  HENT:  Head: Normocephalic and atraumatic.     Right Ear: External ear normal.     Left Ear: External ear normal.     Mouth/Throat:     Mouth: Oropharynx is clear and moist.  Eyes:     General: No scleral icterus.       Right eye: No discharge.        Left eye: No discharge.     Conjunctiva/sclera: Conjunctivae normal.  Neck:     Thyroid: No thyromegaly.  Cardiovascular:     Rate and Rhythm: Normal rate and regular rhythm.  Pulmonary:     Effort: No tachypnea, accessory muscle usage or  respiratory distress.     Breath sounds: Normal breath sounds. No decreased breath sounds or wheezing.  Chest:  Breasts:     Right: No inverted nipple, mass, nipple discharge or tenderness (no axillary adenopathy).     Left: No inverted nipple, mass, nipple discharge or tenderness (no axilarry adenopathy).    Abdominal:     General: Bowel sounds are normal.     Palpations: Abdomen is soft.     Tenderness: There is no abdominal tenderness.  Musculoskeletal:        General: No swelling, tenderness or edema.     Cervical back: Neck supple. No tenderness.  Lymphadenopathy:     Cervical: No cervical adenopathy.  Skin:    Findings: No erythema or rash.  Neurological:     Mental Status: She is alert and oriented to person, place, and time.  Psychiatric:        Mood and Affect: Mood and affect and mood normal.        Behavior: Behavior normal.     BP 110/82 (BP Location: Left Arm, Patient Position: Sitting, Cuff Size: Normal)   Pulse 78   Temp 98.4 F (36.9 C) (Oral)   Ht 5\' 7"  (1.702 m)   Wt 203 lb (92.1 kg)   SpO2 99%   BMI 31.79 kg/m  Wt Readings from Last 3 Encounters:  08/18/20 203 lb (92.1 kg)  11/27/19 194 lb (88 kg)  07/30/19 200 lb (90.7 kg)     Lab Results  Component Value Date   WBC 5.7 11/27/2019   HGB 14.5 11/27/2019   HCT 42.7 11/27/2019   PLT 186.0 11/27/2019   GLUCOSE 91 11/27/2019   ALT 12 11/27/2019   AST 14 11/27/2019   NA 139 11/27/2019   K 3.6 11/27/2019   CL 106 11/27/2019   CREATININE 0.69 11/27/2019   BUN 13 11/27/2019   CO2 26 11/27/2019   TSH 1.76 11/27/2019       Assessment & Plan:   Problem List Items Addressed This Visit    Health care maintenance    Physical today 08/18/20.  Declined pap.  Wanted to wait until next physical.       Palpitations    Doing well.  Follow.            10/16/20, MD

## 2020-08-18 NOTE — Assessment & Plan Note (Signed)
Physical today 08/18/20.  Declined pap.  Wanted to wait until next physical.

## 2020-08-23 ENCOUNTER — Encounter: Payer: Self-pay | Admitting: Internal Medicine

## 2020-08-23 NOTE — Assessment & Plan Note (Signed)
Doing well.  Follow.  

## 2020-11-05 ENCOUNTER — Other Ambulatory Visit: Payer: Self-pay | Admitting: Internal Medicine

## 2021-02-15 DIAGNOSIS — L255 Unspecified contact dermatitis due to plants, except food: Secondary | ICD-10-CM | POA: Diagnosis not present

## 2021-05-11 ENCOUNTER — Encounter: Payer: Self-pay | Admitting: Internal Medicine

## 2021-05-11 NOTE — Telephone Encounter (Signed)
Please call Alana.  Ok to schedule

## 2021-05-13 NOTE — Telephone Encounter (Signed)
Husband scheduled for new patient appt. Will mail new patient packet.

## 2021-08-23 ENCOUNTER — Other Ambulatory Visit: Payer: Self-pay

## 2021-08-23 ENCOUNTER — Encounter: Payer: Self-pay | Admitting: Internal Medicine

## 2021-08-23 ENCOUNTER — Ambulatory Visit (INDEPENDENT_AMBULATORY_CARE_PROVIDER_SITE_OTHER): Payer: 59 | Admitting: Internal Medicine

## 2021-08-23 VITALS — BP 124/82 | HR 79 | Temp 97.6°F | Ht 67.0 in | Wt 215.4 lb

## 2021-08-23 DIAGNOSIS — Z0000 Encounter for general adult medical examination without abnormal findings: Secondary | ICD-10-CM | POA: Diagnosis not present

## 2021-08-23 DIAGNOSIS — R002 Palpitations: Secondary | ICD-10-CM | POA: Diagnosis not present

## 2021-08-23 DIAGNOSIS — Z319 Encounter for procreative management, unspecified: Secondary | ICD-10-CM

## 2021-08-23 DIAGNOSIS — R519 Headache, unspecified: Secondary | ICD-10-CM | POA: Diagnosis not present

## 2021-08-23 DIAGNOSIS — Z124 Encounter for screening for malignant neoplasm of cervix: Secondary | ICD-10-CM

## 2021-08-23 DIAGNOSIS — Z13220 Encounter for screening for lipoid disorders: Secondary | ICD-10-CM

## 2021-08-23 LAB — CBC WITH DIFFERENTIAL/PLATELET
Basophils Absolute: 0 10*3/uL (ref 0.0–0.1)
Basophils Relative: 0.8 % (ref 0.0–3.0)
Eosinophils Absolute: 0.1 10*3/uL (ref 0.0–0.7)
Eosinophils Relative: 2.2 % (ref 0.0–5.0)
HCT: 42.1 % (ref 36.0–46.0)
Hemoglobin: 13.9 g/dL (ref 12.0–15.0)
Lymphocytes Relative: 26.5 % (ref 12.0–46.0)
Lymphs Abs: 1.5 10*3/uL (ref 0.7–4.0)
MCHC: 33.1 g/dL (ref 30.0–36.0)
MCV: 86.8 fl (ref 78.0–100.0)
Monocytes Absolute: 0.3 10*3/uL (ref 0.1–1.0)
Monocytes Relative: 5.7 % (ref 3.0–12.0)
Neutro Abs: 3.8 10*3/uL (ref 1.4–7.7)
Neutrophils Relative %: 64.8 % (ref 43.0–77.0)
Platelets: 217 10*3/uL (ref 150.0–400.0)
RBC: 4.84 Mil/uL (ref 3.87–5.11)
RDW: 13.2 % (ref 11.5–15.5)
WBC: 5.8 10*3/uL (ref 4.0–10.5)

## 2021-08-23 LAB — TSH: TSH: 1.89 u[IU]/mL (ref 0.35–5.50)

## 2021-08-23 LAB — COMPREHENSIVE METABOLIC PANEL
ALT: 12 U/L (ref 0–35)
AST: 20 U/L (ref 0–37)
Albumin: 4.4 g/dL (ref 3.5–5.2)
Alkaline Phosphatase: 40 U/L (ref 39–117)
BUN: 14 mg/dL (ref 6–23)
CO2: 26 mEq/L (ref 19–32)
Calcium: 9.2 mg/dL (ref 8.4–10.5)
Chloride: 104 mEq/L (ref 96–112)
Creatinine, Ser: 0.61 mg/dL (ref 0.40–1.20)
GFR: 122.86 mL/min (ref >60.00)
Glucose, Bld: 75 mg/dL (ref 70–99)
Potassium: 4.1 mEq/L (ref 3.5–5.1)
Sodium: 139 mEq/L (ref 135–145)
Total Bilirubin: 0.4 mg/dL (ref 0.2–1.2)
Total Protein: 7 g/dL (ref 6.0–8.3)

## 2021-08-23 LAB — LIPID PANEL
Cholesterol: 129 mg/dL (ref 0–200)
HDL: 63.3 mg/dL (ref >39.00)
LDL Cholesterol: 58 mg/dL (ref 0–99)
NonHDL: 65.66
Total CHOL/HDL Ratio: 2
Triglycerides: 36 mg/dL (ref 0.0–149.0)
VLDL: 7.2 mg/dL (ref 0.0–40.0)

## 2021-08-23 MED ORDER — MAGNESIUM OXIDE (ELEMENTAL) 400 MG PO TABS: ORAL_TABLET | ORAL | 2 refills | Status: DC

## 2021-08-23 NOTE — Progress Notes (Signed)
Patient ID: Joycelyn K Cristobal, female   DOB: 02/24/1996, 26 y.o.   MRN: [id number] ° ° °Subjective:  ° ° Patient ID: Mikya K Froh, female    DOB: 12/16/1995, 26 y.o.   MRN: [id number] ° °This visit occurred during the SARS-CoV-2 public health emergency.  Safety protocols were in place, including screening questions prior to the visit, additional usage of staff PPE, and extensive cleaning of exam room while observing appropriate contact time as indicated for disinfecting solutions.  ° °Patient here for her physical exam.  ° °Chief Complaint  °Patient presents with  ° Annual Exam  °  Physical   ° .  ° °HPI °She reports she is doing well.  Has a history of migraine headaches.  Has 1-2/week.  Takes excedrin migraine.  Occasionally associated with nausea and vomiting.  Resolves once she gets sick.  Typical migraine.  First started having in fifth grade.  Is back at the gym.  No chest pain or sob with increased activity or exertion.  No palpitations.  No acid reflux.  No abdominal pain.  Bowels moving.  Work is going well.  Having her period today.  Wants to hold on pap smear.  Planning to start soon trying to get pregnant.  Discussed prenatal vitamins.  ° ° °History reviewed. No pertinent past medical history. °Past Surgical History:  °Procedure Laterality Date  ° NO PAST SURGERIES    ° °Family History  °Problem Relation Age of Onset  ° Hypertension Mother   ° Diabetes Mother   ° Hyperlipidemia Father   ° Hypertension Father   ° Breast cancer Other   ° Colon cancer Maternal Grandmother   ° Arthritis Maternal Grandfather   ° Kidney disease Maternal Grandfather   ° Arthritis Paternal Grandmother   ° Heart disease Paternal Grandmother   ° Diabetes Paternal Grandmother   ° °Social History  ° °Socioeconomic History  ° Marital status: Single  °  Spouse name: Not on file  ° Number of children: Not on file  ° Years of education: Not on file  ° Highest education level: Not on file  °Occupational History  ° Not on file  °Tobacco Use  °  Smoking status: Never  ° Smokeless tobacco: Never  °Substance and Sexual Activity  ° Alcohol use: No  °  Alcohol/week: 0.0 standard drinks  ° Drug use: No  ° Sexual activity: Not on file  °Other Topics Concern  ° Not on file  °Social History Narrative  ° Not on file  ° °Social Determinants of Health  ° °Financial Resource Strain: Not on file  °Food Insecurity: Not on file  °Transportation Needs: Not on file  °Physical Activity: Not on file  °Stress: Not on file  °Social Connections: Not on file  ° ° ° °Review of Systems  °Constitutional:  Negative for appetite change and unexpected weight change.  °HENT:  Negative for congestion, sinus pressure and sore throat.   °Eyes:  Negative for pain and visual disturbance.  °Respiratory:  Negative for cough, chest tightness and shortness of breath.   °Cardiovascular:  Negative for chest pain, palpitations and leg swelling.  °Gastrointestinal:  Negative for abdominal pain, diarrhea, nausea and vomiting.  °Genitourinary:  Negative for difficulty urinating and dysuria.  °Musculoskeletal:  Negative for joint swelling and myalgias.  °Skin:  Negative for color change and rash.  °Neurological:  Positive for headaches. Negative for dizziness and light-headedness.  °Hematological:  Negative for adenopathy. Does not bruise/bleed easily.  °Psychiatric/Behavioral:  Negative   for agitation and dysphoric mood.   ° °   °Objective:  °  ° °BP 124/82 (BP Location: Left Arm, Patient Position: Sitting, Cuff Size: Large)    Pulse 79    Temp 97.6 °F (36.4 °C) (Oral)    Ht 5' 7" (1.702 m)    Wt 215 lb 6.4 oz (97.7 kg)    SpO2 99%    BMI 33.74 kg/m²  °Wt Readings from Last 3 Encounters:  °08/23/21 215 lb 6.4 oz (97.7 kg)  °08/18/20 203 lb (92.1 kg)  °11/27/19 194 lb (88 kg)  ° ° °Physical Exam °Vitals reviewed.  °Constitutional:   °   General: She is not in acute distress. °   Appearance: Normal appearance. She is well-developed.  °HENT:  °   Head: Normocephalic and atraumatic.  °   Right Ear:  External ear normal.  °   Left Ear: External ear normal.  °Eyes:  °   General: No scleral icterus.    °   Right eye: No discharge.     °   Left eye: No discharge.  °   Conjunctiva/sclera: Conjunctivae normal.  °Neck:  °   Thyroid: No thyromegaly.  °Cardiovascular:  °   Rate and Rhythm: Normal rate and regular rhythm.  °Pulmonary:  °   Effort: No tachypnea, accessory muscle usage or respiratory distress.  °   Breath sounds: Normal breath sounds. No decreased breath sounds or wheezing.  °Chest:  °Breasts: °   Right: No inverted nipple, mass, nipple discharge or tenderness (no axillary adenopathy).  °   Left: No inverted nipple, mass, nipple discharge or tenderness (no axilarry adenopathy).  °Abdominal:  °   General: Bowel sounds are normal.  °   Palpations: Abdomen is soft.  °   Tenderness: There is no abdominal tenderness.  °Musculoskeletal:     °   General: No swelling or tenderness.  °   Cervical back: Neck supple. No tenderness.  °Lymphadenopathy:  °   Cervical: No cervical adenopathy.  °Skin: °   Findings: No erythema or rash.  °Neurological:  °   Mental Status: She is alert and oriented to person, place, and time.  °Psychiatric:     °   Mood and Affect: Mood normal.     °   Behavior: Behavior normal.  ° ° ° °Outpatient Encounter Medications as of 08/23/2021  °Medication Sig  ° Magnesium Oxide, Elemental, 400 MG TABS Take one tablet q day  ° [DISCONTINUED] JUNEL 1.5/30 1.5-30 MG-MCG tablet TAKE 1 TABLET BY MOUTH EVERY DAY (Patient not taking: Reported on 08/23/2021)  ° °No facility-administered encounter medications on file as of 08/23/2021.  °  ° °Lab Results  °Component Value Date  ° WBC 5.8 08/23/2021  ° HGB 13.9 08/23/2021  ° HCT 42.1 08/23/2021  ° PLT 217.0 08/23/2021  ° GLUCOSE 75 08/23/2021  ° CHOL 129 08/23/2021  ° TRIG 36.0 08/23/2021  ° HDL 63.30 08/23/2021  ° LDLCALC 58 08/23/2021  ° ALT 12 08/23/2021  ° AST 20 08/23/2021  ° NA 139 08/23/2021  ° K 4.1 08/23/2021  ° CL 104 08/23/2021  ° CREATININE 0.61  08/23/2021  ° BUN 14 08/23/2021  ° CO2 26 08/23/2021  ° TSH 1.89 08/23/2021  ° ° °No results found. ° °   °Assessment & Plan:  ° °Problem List Items Addressed This Visit   ° ° Desire for pregnancy  °  Plans to start trying in the near future.  Discussed pre natal   vitamins.  Follow.  Avoid antiinflammatories.   °  °  ° Headache  °  Has a history of migraine headaches.  Headaches as outlined.  Occurring 1-2x/week.  Feels like typical migraine.  Takes excedrin migraine - helps.  Discussed treatment.  Monitor for triggers. Start mag oxide daily.  Follow.   °  °  ° Health care maintenance  °  Physical today 08/23/21.  Having period.  Hold on pap today.  Will do pap at next visit.   °  °  ° Palpitations  °  Doing well.  No significant problems now.  Follow.  °  °  ° Relevant Orders  ° Comp Met (CMET) (Completed)  ° TSH (Completed)  ° CBC with Differential/Platelet (Completed)  ° °Other Visit Diagnoses   ° ° Cervical cancer screening    -  Primary  ° Encounter for preventative adult health care examination      ° Screening cholesterol level      ° Relevant Orders  ° Lipid Profile (Completed)  ° °  ° ° ° °Charlene Scott, MD  °

## 2021-08-29 ENCOUNTER — Encounter: Payer: Self-pay | Admitting: Internal Medicine

## 2021-08-29 DIAGNOSIS — R519 Headache, unspecified: Secondary | ICD-10-CM | POA: Insufficient documentation

## 2021-08-29 DIAGNOSIS — Z319 Encounter for procreative management, unspecified: Secondary | ICD-10-CM | POA: Insufficient documentation

## 2021-08-29 NOTE — Assessment & Plan Note (Signed)
Has a history of migraine headaches.  Headaches as outlined.  Occurring 1-2x/week.  Feels like typical migraine.  Takes excedrin migraine - helps.  Discussed treatment.  Monitor for triggers. Start mag oxide daily.  Follow.

## 2021-08-29 NOTE — Assessment & Plan Note (Signed)
Physical today 08/23/21.  Having period.  Hold on pap today.  Will do pap at next visit.

## 2021-08-29 NOTE — Assessment & Plan Note (Signed)
Plans to start trying in the near future.  Discussed pre natal vitamins.  Follow.  Avoid antiinflammatories.

## 2021-08-29 NOTE — Assessment & Plan Note (Signed)
Doing well.  No significant problems now.  Follow.

## 2021-11-20 ENCOUNTER — Other Ambulatory Visit: Payer: Self-pay | Admitting: Internal Medicine

## 2021-11-22 ENCOUNTER — Other Ambulatory Visit (HOSPITAL_COMMUNITY)
Admission: RE | Admit: 2021-11-22 | Discharge: 2021-11-22 | Disposition: A | Discharge status: Home / Self Care | Payer: 59 | Source: Ambulatory Visit | Attending: Internal Medicine | Admitting: Internal Medicine

## 2021-11-22 ENCOUNTER — Ambulatory Visit (INDEPENDENT_AMBULATORY_CARE_PROVIDER_SITE_OTHER): Payer: 59 | Admitting: Internal Medicine

## 2021-11-22 ENCOUNTER — Encounter: Payer: Self-pay | Admitting: Internal Medicine

## 2021-11-22 VITALS — BP 160/90 | HR 80 | Temp 98.0°F | Resp 15 | Ht 67.0 in | Wt 206.8 lb

## 2021-11-22 DIAGNOSIS — Z124 Encounter for screening for malignant neoplasm of cervix: Secondary | ICD-10-CM | POA: Insufficient documentation

## 2021-11-22 DIAGNOSIS — R002 Palpitations: Secondary | ICD-10-CM | POA: Diagnosis not present

## 2021-11-22 DIAGNOSIS — Z319 Encounter for procreative management, unspecified: Secondary | ICD-10-CM

## 2021-11-22 DIAGNOSIS — R519 Headache, unspecified: Secondary | ICD-10-CM | POA: Diagnosis not present

## 2021-11-22 NOTE — Progress Notes (Signed)
? ?Subjective:  ? ? Patient ID: Cheyenne Beck, female    DOB: 1995-10-30, 26 y.o.   MRN: [id number] ? ?This visit occurred during the SARS-CoV-2 public health emergency.  Safety protocols were in place, including screening questions prior to the visit, additional usage of staff PPE, and extensive cleaning of exam room while observing appropriate contact time as indicated for disinfecting solutions.  ? ?Patient here for a scheduled follow up.  ? ?Chief Complaint  ?Patient presents with  ? Follow-up  ?  Follow up for pap smear  ? .  ? ?HPI ?Reports she is doing relatively well.  Was having her period last visit.  PAP today.  Trying to get pregnant.  Some stress related to this.  Has adjusted her diet - keto.  Headaches are not an issue now.  No chest pain or sob reported.  Stays active.  No abdominal pain or bowel change.   ? ? ?History reviewed. No pertinent past medical history. ?Past Surgical History:  ?Procedure Laterality Date  ? NO PAST SURGERIES    ? ?Family History  ?Problem Relation Age of Onset  ? Hypertension Mother   ? Diabetes Mother   ? Hyperlipidemia Father   ? Hypertension Father   ? Breast cancer Other   ? Colon cancer Maternal Grandmother   ? Arthritis Maternal Grandfather   ? Kidney disease Maternal Grandfather   ? Arthritis Paternal Grandmother   ? Heart disease Paternal Grandmother   ? Diabetes Paternal Grandmother   ? ?Social History  ? ?Socioeconomic History  ? Marital status: Married  ?  Spouse name: Not on file  ? Number of children: Not on file  ? Years of education: Not on file  ? Highest education level: Not on file  ?Occupational History  ? Not on file  ?Tobacco Use  ? Smoking status: Never  ? Smokeless tobacco: Never  ?Substance and Sexual Activity  ? Alcohol use: No  ?  Alcohol/week: 0.0 standard drinks  ? Drug use: No  ? Sexual activity: Not on file  ?Other Topics Concern  ? Not on file  ?Social History Narrative  ? Not on file  ? ?Social Determinants of Health  ? ?Financial Resource  Strain: Not on file  ?Food Insecurity: Not on file  ?Transportation Needs: Not on file  ?Physical Activity: Not on file  ?Stress: Not on file  ?Social Connections: Not on file  ? ? ? ?Review of Systems  ?Constitutional:  Negative for appetite change and unexpected weight change.  ?HENT:  Negative for congestion and sinus pressure.   ?Respiratory:  Negative for cough, chest tightness and shortness of breath.   ?Cardiovascular:  Negative for chest pain, palpitations and leg swelling.  ?Gastrointestinal:  Negative for abdominal pain, diarrhea, nausea and vomiting.  ?Genitourinary:  Negative for difficulty urinating and dysuria.  ?Musculoskeletal:  Negative for joint swelling and myalgias.  ?Skin:  Negative for color change and rash.  ?Neurological:  Negative for dizziness, light-headedness and headaches.  ?Psychiatric/Behavioral:  Negative for agitation and dysphoric mood.   ? ?   ?Objective:  ?  ? ?BP (!) 160/90 (BP Location: Left Arm, Patient Position: Sitting, Cuff Size: Small)   Pulse 80   Temp 98 ?F (36.7 ?C) (Temporal)   Resp 15   Ht 5\' 7"  (1.702 m)   Wt 206 lb 12.8 oz (93.8 kg)   SpO2 100%   BMI 32.39 kg/m?  ?Wt Readings from Last 3 Encounters:  ?11/22/21 206 lb 12.8  oz (93.8 kg)  ?08/23/21 215 lb 6.4 oz (97.7 kg)  ?08/18/20 203 lb (92.1 kg)  ? ? ?Physical Exam ?Vitals reviewed.  ?Constitutional:   ?   General: She is not in acute distress. ?   Appearance: Normal appearance.  ?HENT:  ?   Head: Normocephalic and atraumatic.  ?   Right Ear: External ear normal.  ?   Left Ear: External ear normal.  ?Eyes:  ?   General: No scleral icterus.    ?   Right eye: No discharge.     ?   Left eye: No discharge.  ?   Conjunctiva/sclera: Conjunctivae normal.  ?Neck:  ?   Thyroid: No thyromegaly.  ?Cardiovascular:  ?   Rate and Rhythm: Normal rate and regular rhythm.  ?Pulmonary:  ?   Effort: No respiratory distress.  ?   Breath sounds: Normal breath sounds. No wheezing.  ?Abdominal:  ?   General: Bowel sounds are  normal.  ?   Palpations: Abdomen is soft.  ?   Tenderness: There is no abdominal tenderness.  ?Genitourinary: ?   Comments: Normal external genitalia.  Vaginal vault without lesions.  Cervix identified.  Pap smear performed.  Could not appreciate any adnexal masses or tenderness.   ?Musculoskeletal:     ?   General: No swelling or tenderness.  ?   Cervical back: Neck supple. No tenderness.  ?Lymphadenopathy:  ?   Cervical: No cervical adenopathy.  ?Skin: ?   Findings: No erythema or rash.  ?Neurological:  ?   Mental Status: She is alert.  ?Psychiatric:     ?   Mood and Affect: Mood normal.     ?   Behavior: Behavior normal.  ? ? ? ?Outpatient Encounter Medications as of 11/22/2021  ?Medication Sig  ? magnesium oxide (MAG-OX) 400 (240 Mg) MG tablet TAKE 1 TABLET BY MOUTH EVERY DAY  ? ?No facility-administered encounter medications on file as of 11/22/2021.  ?  ? ?Lab Results  ?Component Value Date  ? WBC 5.8 08/23/2021  ? HGB 13.9 08/23/2021  ? HCT 42.1 08/23/2021  ? PLT 217.0 08/23/2021  ? GLUCOSE 75 08/23/2021  ? CHOL 129 08/23/2021  ? TRIG 36.0 08/23/2021  ? HDL 63.30 08/23/2021  ? LDLCALC 58 08/23/2021  ? ALT 12 08/23/2021  ? AST 20 08/23/2021  ? NA 139 08/23/2021  ? K 4.1 08/23/2021  ? CL 104 08/23/2021  ? CREATININE 0.61 08/23/2021  ? BUN 14 08/23/2021  ? CO2 26 08/23/2021  ? TSH 1.89 08/23/2021  ? ? ?No results found. ? ?   ?Assessment & Plan:  ? ?Problem List Items Addressed This Visit   ? ? Cervical cancer screening  ?  PAP today.  ? ?  ?  ? Desire for pregnancy  ?  Discussed. Some frustration.  Follow.  ? ?  ?  ? Headache  ?  Headaches are better.  Has adjusted diet.  Follow.  ? ?  ?  ? Palpitations  ?  Stable.  Not reported as a significant issue today.  Follow.  ? ?  ?  ? ?Other Visit Diagnoses   ? ? Screening for cervical cancer    -  Primary  ? Relevant Orders  ? Cytology - PAP( [place]) (Completed)  ? ?  ? ? ? ?Dale [place], MD  ?

## 2021-11-24 LAB — CYTOLOGY - PAP
Comment: NEGATIVE
Diagnosis: NEGATIVE
High risk HPV: NEGATIVE

## 2021-11-28 ENCOUNTER — Encounter: Payer: Self-pay | Admitting: Internal Medicine

## 2021-11-28 DIAGNOSIS — Z124 Encounter for screening for malignant neoplasm of cervix: Secondary | ICD-10-CM | POA: Insufficient documentation

## 2021-11-28 NOTE — Assessment & Plan Note (Signed)
Stable.  Not reported as a significant issue today.  Follow.  ?

## 2021-11-28 NOTE — Assessment & Plan Note (Signed)
Discussed. Some frustration.  Follow.  ?

## 2021-11-28 NOTE — Assessment & Plan Note (Signed)
Headaches are better.  Has adjusted diet.  Follow.  ?

## 2021-11-28 NOTE — Assessment & Plan Note (Signed)
-   PAP today

## 2022-01-05 ENCOUNTER — Telehealth: Payer: Self-pay | Admitting: *Deleted

## 2022-01-05 NOTE — Telephone Encounter (Signed)
Left message for pt to call to schedule [name] interview.

## 2022-01-24 DIAGNOSIS — H10022 Other mucopurulent conjunctivitis, left eye: Secondary | ICD-10-CM | POA: Diagnosis not present

## 2022-01-24 DIAGNOSIS — J0110 Acute frontal sinusitis, unspecified: Secondary | ICD-10-CM | POA: Diagnosis not present

## 2022-02-07 ENCOUNTER — Ambulatory Visit (INDEPENDENT_AMBULATORY_CARE_PROVIDER_SITE_OTHER): Payer: 59 | Admitting: *Deleted

## 2022-02-07 ENCOUNTER — Ambulatory Visit (INDEPENDENT_AMBULATORY_CARE_PROVIDER_SITE_OTHER): Payer: 59

## 2022-02-07 VITALS — BP 137/88 | HR 87 | Wt 201.0 lb

## 2022-02-07 DIAGNOSIS — Z3401 Encounter for supervision of normal first pregnancy, first trimester: Secondary | ICD-10-CM

## 2022-02-07 DIAGNOSIS — Z3403 Encounter for supervision of normal first pregnancy, third trimester: Secondary | ICD-10-CM | POA: Insufficient documentation

## 2022-02-07 DIAGNOSIS — O3680X0 Pregnancy with inconclusive fetal viability, not applicable or unspecified: Secondary | ICD-10-CM

## 2022-02-07 DIAGNOSIS — Z3400 Encounter for supervision of normal first pregnancy, unspecified trimester: Secondary | ICD-10-CM | POA: Insufficient documentation

## 2022-02-07 DIAGNOSIS — Z3A08 8 weeks gestation of pregnancy: Secondary | ICD-10-CM

## 2022-02-07 NOTE — Progress Notes (Signed)
[name] Intake  I explained I am completing [name] Intake today. We discussed her EDD of 09/15/22 that is based on LMP. LMP of 12/09/21. Pt is G1/P0. I reviewed her allergies, medications, Medical/Surgical/OB history, and appropriate screenings.   Patient Active Problem List   Diagnosis Date Noted   Encounter for supervision of normal first pregnancy in first trimester 02/07/2022    Concerns addressed today  Delivery Plans:  Plans to deliver at [facility].   MyChart/Babyscripts MyChart access verified. I explained pt will have some visits in office and some virtually. Babyscripts app discussed and ordered.   Blood Pressure Cuff  BP cuff -pt has one at home. Discussed to be used for virtual visits and or if needed BP checks weekly.    Anatomy US Explained first scheduled [place] will be around 19 weeks.   Labs Discussed Cheyenne Beck genetic screening with patient. Would like both Panorama and Horizon drawn at [name] visit. Routine prenatal labs needed.   Placed OB Box on problem list and updated   Patient informed that the ultrasound is considered a limited obstetric ultrasound and is not intended to be a complete ultrasound exam.  Patient also informed that the ultrasound is not being completed with the intent of assessing for fetal or placental anomalies or any pelvic abnormalities. Explained that the purpose of today's ultrasound is to assess for dating and fetal heart rate.  Patient acknowledges the purpose of the exam and the limitations of the study.      First visit review I reviewed [name] appt with pt. I explained she will have ob bloodwork with genetic screening. Explained pt will be seen by Dr Macon Large at first visit.    Cheyenne Marten, RN 02/07/2022  9:39 AM

## 2022-03-01 ENCOUNTER — Encounter: Payer: Self-pay | Admitting: Obstetrics & Gynecology

## 2022-03-01 ENCOUNTER — Other Ambulatory Visit (HOSPITAL_COMMUNITY)
Admission: RE | Admit: 2022-03-01 | Discharge: 2022-03-01 | Disposition: A | Discharge status: Home / Self Care | Payer: 59 | Source: Ambulatory Visit | Attending: Obstetrics & Gynecology | Admitting: Obstetrics & Gynecology

## 2022-03-01 ENCOUNTER — Ambulatory Visit (INDEPENDENT_AMBULATORY_CARE_PROVIDER_SITE_OTHER): Payer: 59 | Admitting: Obstetrics & Gynecology

## 2022-03-01 VITALS — BP 137/89 | HR 92 | Wt 200.2 lb

## 2022-03-01 DIAGNOSIS — Z3401 Encounter for supervision of normal first pregnancy, first trimester: Secondary | ICD-10-CM | POA: Diagnosis not present

## 2022-03-01 DIAGNOSIS — Z3A11 11 weeks gestation of pregnancy: Secondary | ICD-10-CM | POA: Insufficient documentation

## 2022-03-01 DIAGNOSIS — O99210 Obesity complicating pregnancy, unspecified trimester: Secondary | ICD-10-CM

## 2022-03-01 MED ORDER — ASPIRIN 81 MG PO TBEC: 81.0000 mg | DELAYED_RELEASE_TABLET | Freq: Every day | ORAL | 2 refills | Status: DC

## 2022-03-01 NOTE — Progress Notes (Addendum)
History:   Cheyenne Beck is a 26 y.o. G1P0 at [redacted]w[redacted]d by LMP, early ultrasound being seen today for her first obstetrical visit.  Accompanied by her husband.  Patient reports no complaints. Works as a Adult nurse at [employer].      HISTORY: OB History  Gravida Para Term Preterm AB Living  1 0 0 0 0 0  SAB IAB Ectopic Multiple Live Births  0 0 0 0 0    # Outcome Date GA Lbr Len/2nd Weight Sex Delivery Anes PTL Lv  1 Current             Last pap smear was done 11/22/21 and was normal  Past Medical History:  Diagnosis Date   Breast nodule 01/08/2015   Headache    Past Surgical History:  Procedure Laterality Date   NO PAST SURGERIES     Family History  Problem Relation Age of Onset   Hypertension Mother    Diabetes Mother    Hyperlipidemia Father    Hypertension Father    Breast cancer Other    Colon cancer Maternal Grandmother    Arthritis Maternal Grandfather    Kidney disease Maternal Grandfather    Arthritis Paternal Grandmother    Heart disease Paternal Grandmother    Diabetes Paternal Grandmother    Social History   Tobacco Use   Smoking status: Never   Smokeless tobacco: Never  Vaping Use   Vaping Use: Never used  Substance Use Topics   Alcohol use: No    Alcohol/week: 0.0 standard drinks of alcohol   Drug use: No   No Known Allergies Current Outpatient Medications on File Prior to Visit  Medication Sig Dispense Refill   magnesium oxide (MAG-OX) 400 (240 Mg) MG tablet TAKE 1 TABLET BY MOUTH EVERY DAY (Patient not taking: Reported on 02/07/2022) 90 tablet 0   Prenatal Vit-Fe Fumarate-FA (MULTIVITAMIN-PRENATAL) 27-0.8 MG TABS tablet Take 1 tablet by mouth daily at 12 noon.     No current facility-administered medications on file prior to visit.    Review of Systems Pertinent items noted in HPI and remainder of comprehensive ROS otherwise negative.   Physical Exam:   Vitals:   03/01/22 0958  BP: 137/89  Pulse: 92  Weight: 200 lb 3.2 oz  (90.8 kg)   Fetal Heart Rate (bpm): 155  General: well-developed, well-nourished female in no acute distress  Breasts:  deferred  Skin: normal coloration and turgor, no rashes  Neurologic: oriented, normal, negative, normal mood  Extremities: normal strength, tone, and muscle mass, ROM of all joints is normal  HEENT PERRLA, extraocular movement intact and sclera clear, anicteric  Neck supple and no masses  Cardiovascular: regular rate and rhythm  Respiratory:  no respiratory distress, normal breath sounds  Abdomen: soft, non-tender; bowel sounds normal; no masses,  no organomegaly  Pelvic: deferred    Assessment:    Pregnancy: G1P0 Patient Active Problem List   Diagnosis Date Noted   Obesity in pregnancy, antepartum 03/01/2022   Encounter for supervision of normal first pregnancy in first trimester 02/07/2022    Plan:    1. Obesity in pregnancy, antepartum Pregravid BMI 30. Discussed recommended weight gain of 11-20 lbs.  Aspirin recommended for preeclampsia prevention. Surveillance labs done today.  - Comprehensive metabolic panel - TSH - Hemoglobin A1c - Protein / creatinine ratio, urine - [place] MFM OB DETAIL + 14 WK; Future - aspirin EC 81 MG tablet; Take 1 tablet (81 mg total) by mouth daily.  Take after 12 weeks for prevention of preeclampsia later in pregnancy  Dispense: 300 tablet; Refill: 2  2. [redacted] weeks gestation of pregnancy 3. Encounter for supervision of normal first pregnancy in first trimester - HORIZON CUSTOM - PANORAMA PRENATAL TEST FULL PANEL - CBC/D/Plt+RPR+Rh+ABO+RubIgG... - Culture, OB Urine - GC/Chlamydia probe amp ([place])not at [facility]  Initial labs drawn. Continue prenatal vitamins. Problem list reviewed and updated. Genetic Screening discussed, Panorama and Horizon: ordered. Ultrasound discussed; fetal anatomic survey: planned. Anticipatory guidance about prenatal visits given including labs, ultrasounds, and testing. Discussed usage of the  Babyscripts app for more information about pregnancy, and to track blood pressures. Also discussed usage of virtual visits as additional source of managing and completing prenatal visits.  Patient was encouraged to use MyChart to review results, send requests, and have questions addressed.   The nature of [facility] with multiple MDs and Advanced Practice Providers was explained to patient; also emphasized that residents, students are part of our team. Routine obstetric precautions reviewed. Encouraged to seek out care at office or emergency room [facility] preferred) for urgent and/or emergent concerns. Return in about 4 weeks (around 03/29/2022) for OFFICE OB VISIT (MD only).     Jaynie Collins, MD, FACOG Obstetrician & Gynecologist, [facility] for [employer], [facility]

## 2022-03-01 NOTE — Addendum Note (Signed)
Addended by: Jaynie Collins A on: 03/01/2022 01:42 PM   Modules accepted: Orders

## 2022-03-02 LAB — COMPREHENSIVE METABOLIC PANEL
ALT: 10 IU/L (ref 0–32)
AST: 14 IU/L (ref 0–40)
Albumin/Globulin Ratio: 1.6 (ref 1.2–2.2)
Albumin: 4.5 g/dL (ref 4.0–5.0)
Alkaline Phosphatase: 47 IU/L (ref 44–121)
BUN/Creatinine Ratio: 13 (ref 9–23)
BUN: 8 mg/dL (ref 6–20)
Bilirubin Total: 0.5 mg/dL (ref 0.0–1.2)
CO2: 20 mmol/L (ref 20–29)
Calcium: 9.4 mg/dL (ref 8.7–10.2)
Chloride: 100 mmol/L (ref 96–106)
Creatinine, Ser: 0.6 mg/dL (ref 0.57–1.00)
Globulin, Total: 2.8 g/dL (ref 1.5–4.5)
Glucose: 79 mg/dL (ref 70–99)
Potassium: 3.7 mmol/L (ref 3.5–5.2)
Sodium: 136 mmol/L (ref 134–144)
Total Protein: 7.3 g/dL (ref 6.0–8.5)
eGFR: 126 mL/min/{1.73_m2} (ref >59)

## 2022-03-02 LAB — CBC/D/PLT+RPR+RH+ABO+RUBIGG...
Antibody Screen: NEGATIVE
Basophils Absolute: 0.1 10*3/uL (ref 0.0–0.2)
Basos: 1 %
EOS (ABSOLUTE): 0.1 10*3/uL (ref 0.0–0.4)
Eos: 1 %
HCV Ab: NONREACTIVE
HIV Screen 4th Generation wRfx: NONREACTIVE
Hematocrit: 42 % (ref 34.0–46.6)
Hemoglobin: 14.4 g/dL (ref 11.1–15.9)
Hepatitis B Surface Ag: NEGATIVE
Immature Grans (Abs): 0 10*3/uL (ref 0.0–0.1)
Immature Granulocytes: 0 %
Lymphocytes Absolute: 1.8 10*3/uL (ref 0.7–3.1)
Lymphs: 25 %
MCH: 29.8 pg (ref 26.6–33.0)
MCHC: 34.3 g/dL (ref 31.5–35.7)
MCV: 87 fL (ref 79–97)
Monocytes Absolute: 0.4 10*3/uL (ref 0.1–0.9)
Monocytes: 6 %
Neutrophils Absolute: 4.7 10*3/uL (ref 1.4–7.0)
Neutrophils: 67 %
Platelets: 203 10*3/uL (ref 150–450)
RBC: 4.83 x10E6/uL (ref 3.77–5.28)
RDW: 12.8 % (ref 11.7–15.4)
RPR Ser Ql: NONREACTIVE
Rh Factor: POSITIVE
Rubella Antibodies, IGG: <0.9 index — ABNORMAL LOW (ref >0.99)
WBC: 7.1 10*3/uL (ref 3.4–10.8)

## 2022-03-02 LAB — HEMOGLOBIN A1C
Est. average glucose Bld gHb Est-mCnc: 94 mg/dL
Hgb A1c MFr Bld: 4.9 % (ref 4.8–5.6)

## 2022-03-02 LAB — GC/CHLAMYDIA PROBE AMP (~~LOC~~) NOT AT ARMC
Chlamydia: NEGATIVE
Comment: NEGATIVE
Comment: NORMAL
Neisseria Gonorrhea: NEGATIVE

## 2022-03-02 LAB — PROTEIN / CREATININE RATIO, URINE
Creatinine, Urine: 168 mg/dL
Protein, Ur: 15.5 mg/dL
Protein/Creat Ratio: 92 mg/g creat (ref 0–200)

## 2022-03-02 LAB — HCV INTERPRETATION

## 2022-03-02 LAB — TSH: TSH: 2.24 u[IU]/mL (ref 0.450–4.500)

## 2022-03-03 LAB — CULTURE, OB URINE

## 2022-03-03 LAB — URINE CULTURE, OB REFLEX: Organism ID, Bacteria: NO GROWTH

## 2022-03-06 LAB — PANORAMA PRENATAL TEST FULL PANEL:PANORAMA TEST PLUS 5 ADDITIONAL MICRODELETIONS: FETAL FRACTION: 4.4

## 2022-03-09 LAB — HORIZON CUSTOM: REPORT SUMMARY: NEGATIVE

## 2022-03-29 ENCOUNTER — Ambulatory Visit (INDEPENDENT_AMBULATORY_CARE_PROVIDER_SITE_OTHER): Payer: 59 | Admitting: Obstetrics and Gynecology

## 2022-03-29 VITALS — BP 124/80 | HR 93 | Wt 201.0 lb

## 2022-03-29 DIAGNOSIS — O09899 Supervision of other high risk pregnancies, unspecified trimester: Secondary | ICD-10-CM

## 2022-03-29 DIAGNOSIS — Z6831 Body mass index (BMI) 31.0-31.9, adult: Secondary | ICD-10-CM

## 2022-03-29 DIAGNOSIS — Z6834 Body mass index (BMI) 34.0-34.9, adult: Secondary | ICD-10-CM | POA: Insufficient documentation

## 2022-03-29 DIAGNOSIS — O132 Gestational [pregnancy-induced] hypertension without significant proteinuria, second trimester: Secondary | ICD-10-CM | POA: Diagnosis not present

## 2022-03-29 DIAGNOSIS — Z2839 Other underimmunization status: Secondary | ICD-10-CM

## 2022-03-29 DIAGNOSIS — O99210 Obesity complicating pregnancy, unspecified trimester: Secondary | ICD-10-CM

## 2022-03-29 DIAGNOSIS — Z3401 Encounter for supervision of normal first pregnancy, first trimester: Secondary | ICD-10-CM

## 2022-03-29 NOTE — Progress Notes (Signed)
   PRENATAL VISIT NOTE  Subjective:  Cheyenne Beck is a 26 y.o. G1P0 at [redacted]w[redacted]d being seen today for ongoing prenatal care.  She is currently monitored for the following issues for this low-risk pregnancy and has Encounter for supervision of normal first pregnancy in first trimester; Obesity in pregnancy, antepartum; Transient hypertension of pregnancy in second trimester; and BMI 31.0-31.9,adult on their problem list.  Patient reports no complaints.  Contractions: Not present. Vag. Bleeding: None.  Movement: Absent. Denies leaking of fluid.   The following portions of the patient's history were reviewed and updated as appropriate: allergies, current medications, past family history, past medical history, past social history, past surgical history and problem list.   Objective:   Vitals:   03/29/22 1422 03/29/22 1440  BP: (!) 143/83 124/80  Pulse: 96 93  Weight: 201 lb (91.2 kg)     Fetal Status: Fetal Heart Rate (bpm): 152   Movement: Absent     General:  Alert, oriented and cooperative. Patient is in no acute distress.  Skin: Skin is warm and dry. No rash noted.   Cardiovascular: Normal heart rate noted  Respiratory: Normal respiratory effort, no problems with respiration noted  Abdomen: Soft, gravid, appropriate for gestational age.  Pain/Pressure: Present     Pelvic: Cervical exam deferred        Extremities: Normal range of motion.  Edema: None  Mental Status: Normal mood and affect. Normal behavior. Normal judgment and thought content.   Assessment and Plan:  Pregnancy: G1P0 at [redacted]w[redacted]d 1. Encounter for supervision of normal first pregnancy in first trimester Anatomy u/s already scheduled  2. Transient hypertension of pregnancy in second trimester Normal on rpt. I d/w her that I recommend the low dose asa as well as moderate walking/exercise 5 days a week to lessen risk of HTN in pregnancy. Baseline labs wnl last visit - AFP, Serum, Open Spina Bifida  3. BMI  31.0-31.9,adult Weight stable. TWG 3lbs  4. Obesity in pregnancy, antepartum  5. Rubella non-immune status, antepartum  Preterm labor symptoms and general obstetric precautions including but not limited to vaginal bleeding, contractions, leaking of fluid and fetal movement were reviewed in detail with the patient. Please refer to After Visit Summary for other counseling recommendations.   No follow-ups on file.  Future Appointments  Date Time Provider Department Center  04/21/2022  9:30 AM [facility] NURSE [facility]  04/21/2022  9:45 AM [facility] US5 [facility]  04/26/2022  1:50 PM Constant, Gigi Gin, MD [facility]  05/23/2022  1:30 PM Federico Flake, MD [facility]  08/29/2022  8:30 AM [place], MD [facility]    Kemp Bing, MD

## 2022-03-29 NOTE — Progress Notes (Signed)
ROB [redacted]w[redacted]d   Will Recheck B/P pt denies any HA's or swelling or visual changes.  Pt has not started Asprin.

## 2022-03-31 LAB — AFP, SERUM, OPEN SPINA BIFIDA
AFP MoM: 1.42
AFP Value: 36 ng/mL
Gest. Age on Collection Date: 15 weeks
Maternal Age At EDD: 28.1 yr
OSBR Risk 1 IN: 3404
Test Results:: NEGATIVE
Weight: 201 [lb_av]

## 2022-04-21 ENCOUNTER — Ambulatory Visit: Payer: 59 | Attending: Obstetrics & Gynecology

## 2022-04-21 ENCOUNTER — Encounter: Payer: Self-pay | Admitting: Obstetrics & Gynecology

## 2022-04-21 ENCOUNTER — Other Ambulatory Visit: Payer: Self-pay | Admitting: *Deleted

## 2022-04-21 ENCOUNTER — Ambulatory Visit: Payer: 59 | Admitting: *Deleted

## 2022-04-21 VITALS — BP 136/67 | HR 76

## 2022-04-21 DIAGNOSIS — O321XX0 Maternal care for breech presentation, not applicable or unspecified: Secondary | ICD-10-CM | POA: Insufficient documentation

## 2022-04-21 DIAGNOSIS — O99212 Obesity complicating pregnancy, second trimester: Secondary | ICD-10-CM | POA: Diagnosis not present

## 2022-04-21 DIAGNOSIS — O99210 Obesity complicating pregnancy, unspecified trimester: Secondary | ICD-10-CM | POA: Diagnosis not present

## 2022-04-21 DIAGNOSIS — Z363 Encounter for antenatal screening for malformations: Secondary | ICD-10-CM | POA: Diagnosis not present

## 2022-04-21 DIAGNOSIS — O139 Gestational [pregnancy-induced] hypertension without significant proteinuria, unspecified trimester: Secondary | ICD-10-CM

## 2022-04-21 DIAGNOSIS — Z3401 Encounter for supervision of normal first pregnancy, first trimester: Secondary | ICD-10-CM | POA: Diagnosis not present

## 2022-04-21 DIAGNOSIS — R638 Other symptoms and signs concerning food and fluid intake: Secondary | ICD-10-CM

## 2022-04-21 DIAGNOSIS — Z3A19 19 weeks gestation of pregnancy: Secondary | ICD-10-CM | POA: Insufficient documentation

## 2022-04-26 ENCOUNTER — Encounter: Payer: 59 | Admitting: Obstetrics and Gynecology

## 2022-05-02 ENCOUNTER — Encounter: Payer: Self-pay | Admitting: Family Medicine

## 2022-05-02 ENCOUNTER — Ambulatory Visit (INDEPENDENT_AMBULATORY_CARE_PROVIDER_SITE_OTHER): Payer: 59 | Admitting: Family Medicine

## 2022-05-02 VITALS — BP 137/82 | HR 86 | Wt 205.0 lb

## 2022-05-02 DIAGNOSIS — O99212 Obesity complicating pregnancy, second trimester: Secondary | ICD-10-CM

## 2022-05-02 DIAGNOSIS — O99210 Obesity complicating pregnancy, unspecified trimester: Secondary | ICD-10-CM

## 2022-05-02 DIAGNOSIS — O132 Gestational [pregnancy-induced] hypertension without significant proteinuria, second trimester: Secondary | ICD-10-CM

## 2022-05-02 DIAGNOSIS — Z3402 Encounter for supervision of normal first pregnancy, second trimester: Secondary | ICD-10-CM

## 2022-05-02 NOTE — Progress Notes (Signed)
   PRENATAL VISIT NOTE  Subjective:  Cheyenne Beck is a 26 y.o. G1P0 at [redacted]w[redacted]d being seen today for ongoing prenatal care.  She is currently monitored for the following issues for this high-risk pregnancy and has Encounter for supervision of normal first pregnancy; Obesity in pregnancy, antepartum; Transient hypertension of pregnancy in second trimester; and BMI 31.0-31.9,adult on their problem list.  Patient reports no complaints.  Contractions: Not present. Vag. Bleeding: None.  Movement: Present. Denies leaking of fluid.   The following portions of the patient's history were reviewed and updated as appropriate: allergies, current medications, past family history, past medical history, past social history, past surgical history and problem list.   Objective:   Vitals:   05/02/22 1336  BP: 137/82  Pulse: 86  Weight: 205 lb (93 kg)    Fetal Status: Fetal Heart Rate (bpm): 130 Fundal Height: 21 cm Movement: Present     General:  Alert, oriented and cooperative. Patient is in no acute distress.  Skin: Skin is warm and dry. No rash noted.   Cardiovascular: Normal heart rate noted  Respiratory: Normal respiratory effort, no problems with respiration noted  Abdomen: Soft, gravid, appropriate for gestational age.  Pain/Pressure: Absent     Pelvic: Cervical exam deferred        Extremities: Normal range of motion.  Edema: None  Mental Status: Normal mood and affect. Normal behavior. Normal judgment and thought content.   Assessment and Plan:  Pregnancy: G1P0 at [redacted]w[redacted]d 1. Transient hypertension of pregnancy in second trimester BP wnl today  2. Obesity in pregnancy, antepartum TWG=7 lb (3.175 kg) which is at goal  3. Encounter for supervision of normal first pregnancy in second trimester Feeling movement Needs repeat US due to not having all images No concerns today otherwise  Preterm labor symptoms and general obstetric precautions including but not limited to vaginal bleeding,  contractions, leaking of fluid and fetal movement were reviewed in detail with the patient. Please refer to After Visit Summary for other counseling recommendations.   Return in about 4 weeks (around 05/30/2022) for Routine prenatal care.  Future Appointments  Date Time Provider [place]  05/24/2022 10:30 AM [facility] NURSE [facility]  05/24/2022 10:45 AM [facility] US5 [facility]  05/24/2022  3:50 PM Anyanwu, Sallyanne Havers, MD [facility]  06/20/2022 12:30 PM [facility] NURSE [facility]  06/20/2022 12:45 PM [facility] US4 [facility]  06/22/2022  8:55 AM Donnamae Jude, MD [facility]  07/04/2022  1:30 PM Anyanwu, Sallyanne Havers, MD [facility]  08/29/2022  8:30 AM Einar Pheasant, MD [facility]    Caren Macadam, MD

## 2022-05-23 ENCOUNTER — Encounter: Payer: 59 | Admitting: Family Medicine

## 2022-05-24 ENCOUNTER — Ambulatory Visit (INDEPENDENT_AMBULATORY_CARE_PROVIDER_SITE_OTHER): Payer: 59 | Admitting: Obstetrics & Gynecology

## 2022-05-24 ENCOUNTER — Ambulatory Visit: Payer: 59 | Admitting: *Deleted

## 2022-05-24 ENCOUNTER — Ambulatory Visit: Payer: 59 | Attending: Maternal & Fetal Medicine

## 2022-05-24 VITALS — BP 119/71 | HR 71

## 2022-05-24 VITALS — BP 130/77 | HR 76 | Wt 210.0 lb

## 2022-05-24 DIAGNOSIS — Z3A23 23 weeks gestation of pregnancy: Secondary | ICD-10-CM | POA: Insufficient documentation

## 2022-05-24 DIAGNOSIS — O139 Gestational [pregnancy-induced] hypertension without significant proteinuria, unspecified trimester: Secondary | ICD-10-CM | POA: Diagnosis not present

## 2022-05-24 DIAGNOSIS — Z363 Encounter for antenatal screening for malformations: Secondary | ICD-10-CM | POA: Insufficient documentation

## 2022-05-24 DIAGNOSIS — O99210 Obesity complicating pregnancy, unspecified trimester: Secondary | ICD-10-CM

## 2022-05-24 DIAGNOSIS — R638 Other symptoms and signs concerning food and fluid intake: Secondary | ICD-10-CM | POA: Diagnosis not present

## 2022-05-24 DIAGNOSIS — Z3402 Encounter for supervision of normal first pregnancy, second trimester: Secondary | ICD-10-CM | POA: Insufficient documentation

## 2022-05-24 DIAGNOSIS — E669 Obesity, unspecified: Secondary | ICD-10-CM

## 2022-05-24 DIAGNOSIS — O99212 Obesity complicating pregnancy, second trimester: Secondary | ICD-10-CM | POA: Diagnosis not present

## 2022-05-24 NOTE — Patient Instructions (Signed)
Oral Glucose Tolerance Test During Pregnancy Why am I having this test? The oral glucose tolerance test (GTT) is done to check how your body processes blood sugar (glucose). This is one of several tests used to diagnose diabetes that develops during pregnancy (gestational diabetes mellitus). Gestational diabetes is a short-term form of diabetes that some women develop while they are pregnant. It usually occurs during the second or third trimester of pregnancy and goes away after delivery. Testing, or screening, for gestational diabetes usually occurs around 28 of pregnancy. This test may also be needed earlier if: You have a history of gestational diabetes. There is a history of giving birth to very large babies or of losing pregnancies (having stillbirths). You have signs and symptoms of diabetes, such as: Changes in your eyesight. Tingling or numbness in your hands or feet. Changes in hunger, thirst, and urination, and these are not explained by your pregnancy. What is being tested? This test measures the amount of glucose in your blood at different times during a period of 2 hours. This shows how well your body can process glucose.  You will have three separate blood draws. What kind of sample is taken?  Blood samples are required for this test. They are usually collected by inserting a needle into a blood vessel. How do I prepare for this test? For 3 days before your test, eat normally. Have plenty of carbohydrate-rich foods. You will be asked not to eat or drink anything other than water (to fast) starting 8-10 hours before the test. Tell a health care provider about: All medicines you are taking, including vitamins, herbs, eye drops, creams, and over-the-counter medicines. Any blood disorders you have. Any surgeries you have had. Any medical conditions you have. What happens during the test? First, your blood glucose will be measured. This is referred to as your fasting blood glucose  because you fasted before the test. Then, you will drink a glucose solution that contains a certain amount of glucose. Your blood glucose will be measured again 1 and 2 hours after you drink the solution. This test takes about 2 hours to complete. You will need to stay at the testing location during this time. During the testing period: Do not eat or drink anything other than the glucose solution. Do not exercise. Do not use any products that contain nicotine or tobacco, such as cigarettes, e-cigarettes, and chewing tobacco. These can affect your test results. If you need help quitting, ask your health care provider. The testing procedure may vary among health care providers and hospitals. How are the results reported? Your results will be reported as milligrams of glucose per deciliter of blood (mg/dL) or millimoles per liter (mmol/L). There is more than one source for screening and diagnosis reference values used to diagnose gestational diabetes. Your health care provider will compare your results to normal values that were established after testing a large group of people (reference values). Reference values may vary among labs and hospitals. For this test, reference values are: Fasting: 92 mg/dL 1 hour: 180 mg/dL  2 hour: 153 mg/dL   What do the results mean? Results below the reference values are considered normal. If one or more of your blood glucose levels are at or above the reference values, you will be diagnosed with gestational diabetes.  Talk with your health care provider about what your results mean. Questions to ask your health care provider Ask your health care provider, or the department that is doing the test: When   will my results be ready? How will I get my results? What are my treatment options? What other tests do I need? What are my next steps? Summary The oral glucose tolerance test (GTT) is one of several tests used to diagnose diabetes that develops during pregnancy  (gestational diabetes mellitus). Gestational diabetes is a short-term form of diabetes that some women develop while they are pregnant. You may also have this test if you have any symptoms or risk factors for this type of diabetes. Talk with your health care provider about what your results mean. This information is not intended to replace advice given to you by your health care provider. Make sure you discuss any questions you have with your health care provider.  TDaP Vaccine Pregnancy Get the Whooping Cough Vaccine While You Are Pregnant (CDC)  It is important for women to get the whooping cough vaccine in the third trimester of each pregnancy. Vaccines are the best way to prevent this disease. There are 2 different whooping cough vaccines. Both vaccines combine protection against whooping cough, tetanus and diphtheria, but they are for different age groups: Tdap: for everyone 11 years or older, including pregnant women  DTaP: for children 2 months through 6 years of age  You need the whooping cough vaccine during each of your pregnancies The recommended time to get the shot is during your 27th through 36th week of pregnancy, preferably during the earlier part of this time period. The Centers for Disease Control and Prevention (CDC) recommends that pregnant women receive the whooping cough vaccine for adolescents and adults (called Tdap vaccine) during the third trimester of each pregnancy. The recommended time to get the shot is during your 27th through 36th week of pregnancy, preferably during the earlier part of this time period. This replaces the original recommendation that pregnant women get the vaccine only if they had not previously received it. The American College of Obstetricians and Gynecologists and the American College of Nurse-Midwives support this recommendation.  You should get the whooping cough vaccine while pregnant to pass protection to your baby frame support disabled  and/or not supported in this browser  Learn why Cheyenne Beck decided to get the whooping cough vaccine in her 3rd trimester of pregnancy and how her baby girl was born with some protection against the disease. Also available on YouTube. After receiving the whooping cough vaccine, your body will create protective antibodies (proteins produced by the body to fight off diseases) and pass some of them to your baby before birth. These antibodies provide your baby some short-term protection against whooping cough in early life. These antibodies can also protect your baby from some of the more serious complications that come along with whooping cough. Your protective antibodies are at their highest about 2 weeks after getting the vaccine, but it takes time to pass them to your baby. So the preferred time to get the whooping cough vaccine is early in your third trimester. The amount of whooping cough antibodies in your body decreases over time. That is why CDC recommends you get a whooping cough vaccine during each pregnancy. Doing so allows each of your babies to get the greatest number of protective antibodies from you. This means each of your babies will get the best protection possible against this disease.  Getting the whooping cough vaccine while pregnant is better than getting the vaccine after you give birth Whooping cough vaccination during pregnancy is ideal so your baby will have short-term protection as soon as he   is born. This early protection is important because your baby will not start getting his whooping cough vaccines until he is 2 months old. These first few months of life are when your baby is at greatest risk for catching whooping cough. This is also when he's at greatest risk for having severe, potentially life-threating complications from the infection. To avoid that gap in protection, it is best to get a whooping cough vaccine during pregnancy. You will then pass protection to your baby before he  is born. To continue protecting your baby, he should get whooping cough vaccines starting at 2 months old. You may never have gotten the Tdap vaccine before and did not get it during this pregnancy. If so, you should make sure to get the vaccine immediately after you give birth, before leaving the hospital or birthing center. It will take about 2 weeks before your body develops protection (antibodies) in response to the vaccine. Once you have protection from the vaccine, you are less likely to give whooping cough to your newborn while caring for him. But remember, your baby will still be at risk for catching whooping cough from others. A recent study looked to see how effective Tdap was at preventing whooping cough in babies whose mothers got the vaccine while pregnant or in the hospital after giving birth. The study found that getting Tdap between 27 through 36 weeks of pregnancy is 85% more effective at preventing whooping cough in babies younger than 2 months old. Blood tests cannot tell if you need a whooping cough vaccine There are no blood tests that can tell you if you have enough antibodies in your body to protect yourself or your baby against whooping cough. Even if you have been sick with whooping cough in the past or previously received the vaccine, you still should get the vaccine during each pregnancy. Breastfeeding may pass some protective antibodies onto your baby By breastfeeding, you may pass some antibodies you have made in response to the vaccine to your baby. When you get a whooping cough vaccine during your pregnancy, you will have antibodies in your breast milk that you can share with your baby as soon as your milk comes in. However, your baby will not get protective antibodies immediately if you wait to get the whooping cough vaccine until after delivering your baby. This is because it takes about 2 weeks for your body to create antibodies. Learn more about the health benefits of  breastfeeding.  

## 2022-05-24 NOTE — Progress Notes (Signed)
PRENATAL VISIT NOTE  Subjective:  Cheyenne Beck is a 26 y.o. G1P0 at [redacted]w[redacted]d being seen today for ongoing prenatal care.  She is currently monitored for the following issues for this low-risk pregnancy and has Encounter for supervision of normal first pregnancy; Obesity in pregnancy, antepartum; Transient hypertension of pregnancy in second trimester; and BMI 31.0-31.9,adult on their problem list.  Patient reports no complaints.  Contractions: Not present. Vag. Bleeding: None.  Movement: Present. Denies leaking of fluid.   The following portions of the patient's history were reviewed and updated as appropriate: allergies, current medications, past family history, past medical history, past social history, past surgical history and problem list.   Objective:   Vitals:   05/24/22 1547 05/24/22 1605  BP: 132/79 130/77  Pulse: 88 76  Weight: 210 lb (95.3 kg)     Fetal Status: Fetal Heart Rate (bpm): 140   Movement: Present     General:  Alert, oriented and cooperative. Patient is in no acute distress.  Skin: Skin is warm and dry. No rash noted.   Cardiovascular: Normal heart rate noted  Respiratory: Normal respiratory effort, no problems with respiration noted  Abdomen: Soft, gravid, appropriate for gestational age.  Pain/Pressure: Absent     Pelvic: Cervical exam deferred        Extremities: Normal range of motion.  Edema: None  Mental Status: Normal mood and affect. Normal behavior. Normal judgment and thought content.   [place] MFM OB FOLLOW UP  Result Date: 05/24/2022 ----------------------------------------------------------------------  OBSTETRICS REPORT                       (Signed Final 05/24/2022 02:54 pm) ---------------------------------------------------------------------- Patient Info  ID #:       [id number]                          D.O.B.:  1996-05-13 (26 yrs)  Name:       Cheyenne Beck                 Visit Date: 05/24/2022 10:32 am  ---------------------------------------------------------------------- Performed By  Attending:        Ma Rings MD         Ref. Address:     [address]                                                             [address]  Performed By:     Reinaldo Raddle            [facility]  Referred By:      [facility] ---------------------------------------------------------------------- Orders  #  Description                           Code        Ordered By  1  [place] MFM OB FOLLOW UP                   [phone]    Braxton Feathers ----------------------------------------------------------------------  #  Order #                     Accession #                Episode #  1  [id number]                   [id number]                 [id number] ---------------------------------------------------------------------- Indications  Obesity complicating pregnancy, second         O99.212  trimester (BMI 31)  Encounter for antenatal screening for          Z36.3  malformations  [redacted] weeks gestation of pregnancy                Z3A.23  LR NIPS/Neg AFP/Neg Horizon ---------------------------------------------------------------------- Fetal Evaluation  Num Of Fetuses:         1  Fetal Heart Rate(bpm):  140  Cardiac Activity:       Observed  Presentation:           Cephalic  Placenta:               Anterior  P. Cord Insertion:      Visualized, central  Amniotic Fluid  AFI FV:      Within normal limits                              Largest Pocket(cm)                              3.3 ---------------------------------------------------------------------- Biometry  BPD:      56.8  mm     G. Age:  23w 3d         31  %    CI:        74.55   %    70 - 86                                                          FL/HC:       20.1   %    18.7 - 20.9  HC:      208.8  mm     G. Age:  23w 0d  11  %    HC/AC:      1.18        1.05 - 1.21  AC:      177.3  mm     G. Age:  22w 4d         13  %    FL/BPD:     73.9   %    71 - 87  FL:         42  mm     G. Age:  23w 5d         36  %    FL/AC:      23.7   %    20 - 24  HUM:      41.8  mm     G. Age:  25w 2d         77  %  LV:          5  mm  Est. FW:     562  gm      1 lb 4 oz     17  % ---------------------------------------------------------------------- OB History  Gravidity:    1         Term:   0        Prem:   0        SAB:   0  TOP:          0       Ectopic:  0        Living: 0 ---------------------------------------------------------------------- Gestational Age  LMP:           23w 5d        Date:  12/09/21                   EDD:   09/15/22  U/S Today:     23w 1d                                        EDD:   09/19/22  Best:          23w 5d     Det. By:  LMP  (12/09/21)          EDD:   09/15/22 ---------------------------------------------------------------------- Anatomy  Cranium:               Appears normal         LVOT:                   Appears normal  Cavum:                 Appears normal         Aortic Arch:            Appears normal  Ventricles:            Appears normal         Ductal Arch:            Not well visualized  Choroid Plexus:        Previously seen        Diaphragm:              Appears normal  Cerebellum:            Previously seen        Stomach:  Appears normal, left                                                                        sided  Posterior Fossa:       Previously seen        Abdomen:                Appears normal  Nuchal Fold:           Previously seen        Abdominal Wall:         Previously seen  Face:                  Appears normal         Cord Vessels:           Previously seen                         (orbits and profile)  Lips:                  Appears normal         Kidneys:                Appear normal  Palate:                Not  well visualized    Bladder:                Appears normal  Thoracic:              Appears normal         Spine:                  Appears normal  Heart:                 Appears normal; EIF    Upper Extremities:      Previously seen  RVOT:                  Appears normal         Lower Extremities:      Previously seen  Other:  Fetus prev appears to be female. Technically difficult due to fetal          position. 3VV and 3VTV visualized. Nasal bone, lenses, maxilla,          mandible and falx visualized. ---------------------------------------------------------------------- Cervix Uterus Adnexa  Cervix  Length:            3.4  cm.  Normal appearance by transabdominal scan.  Uterus  No abnormality visualized.  Right Ovary  Not visualized.  Left Ovary  Not visualized.  Cul De Sac  No free fluid seen.  Adnexa  No adnexal mass visualized. ---------------------------------------------------------------------- Comments  This patient was seen for a follow up exam as the fetal  cardiac views were unable to be fully visualized during her  prior exam.  She denies any problems since her last exam.  She was informed that the fetal growth and amniotic fluid  level appears appropriate for her gestational age.  The views of the fetal heart were visualized today.  There  were no obvious  fetal cardiac anomalies noted other than the  previously noted echogenic focus in the left ventricle of the  fetal heart.  The limitations of ultrasound in the detection of all anomalies  was discussed.  Due to the echogenic focus noted today, the patient was  offered and declined an amniocentesis today for definitive  diagnosis of fetal aneuploidy.  She reports that she is  comfortable with her negative cell free DNA test.  Due to maternal obesity, a follow-up exam was scheduled in  4 weeks. ----------------------------------------------------------------------                   Johnell Comings, MD Electronically Signed Final Report   05/24/2022 02:54 pm  ----------------------------------------------------------------------   Assessment and Plan:  Pregnancy: G1P0 at [redacted]w[redacted]d 1. [redacted] weeks gestation of pregnancy 2. Encounter for supervision of normal first pregnancy in second trimester Preterm labor symptoms and general obstetric precautions including but not limited to vaginal bleeding, contractions, leaking of fluid and fetal movement were reviewed in detail with the patient. Please refer to After Visit Summary for other counseling recommendations.   Return in about 4 weeks (around 06/21/2022) for 2 hr GTT, 3rd trimester labs, TDap, OFFICE OB VISIT (MD or APP).  Future Appointments  Date Time Provider [place]  06/20/2022 12:30 PM [facility] NURSE [facility]  06/20/2022 12:45 PM [facility] US4 [facility]  06/22/2022  8:55 AM Donnamae Jude, MD [facility]  07/04/2022  1:30 PM Alexie Lanni, Sallyanne Havers, MD [facility]  07/19/2022  3:50 PM Mehak Roskelley, Sallyanne Havers, MD [facility]  08/04/2022  4:10 PM Darlina Rumpf, CNM [facility]  08/29/2022  8:30 AM Einar Pheasant, MD [facility]    Verita Schneiders, MD

## 2022-06-20 ENCOUNTER — Ambulatory Visit: Payer: 59 | Admitting: *Deleted

## 2022-06-20 ENCOUNTER — Ambulatory Visit: Payer: 59 | Attending: Maternal & Fetal Medicine

## 2022-06-20 VITALS — BP 134/73 | HR 73

## 2022-06-20 DIAGNOSIS — O99212 Obesity complicating pregnancy, second trimester: Secondary | ICD-10-CM

## 2022-06-20 DIAGNOSIS — O283 Abnormal ultrasonic finding on antenatal screening of mother: Secondary | ICD-10-CM | POA: Diagnosis not present

## 2022-06-20 DIAGNOSIS — O139 Gestational [pregnancy-induced] hypertension without significant proteinuria, unspecified trimester: Secondary | ICD-10-CM | POA: Diagnosis not present

## 2022-06-20 DIAGNOSIS — E669 Obesity, unspecified: Secondary | ICD-10-CM | POA: Diagnosis not present

## 2022-06-20 DIAGNOSIS — R638 Other symptoms and signs concerning food and fluid intake: Secondary | ICD-10-CM | POA: Diagnosis not present

## 2022-06-20 DIAGNOSIS — Z3A27 27 weeks gestation of pregnancy: Secondary | ICD-10-CM

## 2022-06-22 ENCOUNTER — Encounter: Payer: Self-pay | Admitting: Family Medicine

## 2022-06-22 ENCOUNTER — Other Ambulatory Visit: Payer: 59

## 2022-06-22 ENCOUNTER — Ambulatory Visit (INDEPENDENT_AMBULATORY_CARE_PROVIDER_SITE_OTHER): Payer: 59 | Admitting: Family Medicine

## 2022-06-22 VITALS — BP 129/80 | HR 77 | Wt 214.0 lb

## 2022-06-22 DIAGNOSIS — O09899 Supervision of other high risk pregnancies, unspecified trimester: Secondary | ICD-10-CM | POA: Insufficient documentation

## 2022-06-22 DIAGNOSIS — Z23 Encounter for immunization: Secondary | ICD-10-CM | POA: Diagnosis not present

## 2022-06-22 DIAGNOSIS — Z3A27 27 weeks gestation of pregnancy: Secondary | ICD-10-CM | POA: Diagnosis not present

## 2022-06-22 DIAGNOSIS — O09892 Supervision of other high risk pregnancies, second trimester: Secondary | ICD-10-CM

## 2022-06-22 DIAGNOSIS — Z3402 Encounter for supervision of normal first pregnancy, second trimester: Secondary | ICD-10-CM

## 2022-06-22 DIAGNOSIS — Z2839 Other underimmunization status: Secondary | ICD-10-CM

## 2022-06-22 HISTORY — DX: Supervision of other high risk pregnancies, unspecified trimester: O09.899

## 2022-06-22 HISTORY — DX: Other underimmunization status: Z28.39

## 2022-06-22 NOTE — Progress Notes (Signed)
ROB [redacted]w[redacted]d  CC: None   Pt has received Flu Vaccine.   Tdap offered.

## 2022-06-22 NOTE — Progress Notes (Signed)
   PRENATAL VISIT NOTE  Subjective:  Cheyenne Beck is a 26 y.o. G1P0 at 53w6dbeing seen today for ongoing prenatal care.  She is currently monitored for the following issues for this low-risk pregnancy and has Encounter for supervision of normal first pregnancy; Obesity in pregnancy, antepartum; Transient hypertension of pregnancy in second trimester; BMI 31.0-31.9,adult; and Rubella non-immune status, antepartum on their problem list.  Patient reports no complaints.  Contractions: Not present.  .  Movement: Present. Denies leaking of fluid.   The following portions of the patient's history were reviewed and updated as appropriate: allergies, current medications, past family history, past medical history, past social history, past surgical history and problem list.   Objective:   Vitals:   06/22/22 0839  BP: 129/80  Pulse: 77  Weight: 214 lb (97.1 kg)    Fetal Status: Fetal Heart Rate (bpm): 138 Fundal Height: 29 cm Movement: Present     General:  Alert, oriented and cooperative. Patient is in no acute distress.  Skin: Skin is warm and dry. No rash noted.   Cardiovascular: Normal heart rate noted  Respiratory: Normal respiratory effort, no problems with respiration noted  Abdomen: Soft, gravid, appropriate for gestational age.  Pain/Pressure: Absent     Pelvic: Cervical exam deferred        Extremities: Normal range of motion.  Edema: None  Mental Status: Normal mood and affect. Normal behavior. Normal judgment and thought content.   Assessment and Plan:  Pregnancy: G1P0 at 218w6d. Encounter for supervision of normal first pregnancy in second trimester 28 wk labs and TDaP today  2. Rubella non-immune status, antepartum Needs MMR pp  3. Need for Tdap vaccination - Tdap vaccine greater than or equal to 7yo IM  Preterm labor symptoms and general obstetric precautions including but not limited to vaginal bleeding, contractions, leaking of fluid and fetal movement were  reviewed in detail with the patient. Please refer to After Visit Summary for other counseling recommendations.   Return in 2 weeks (on 07/06/2022).  Future Appointments  Date Time Provider [place]11/27/2023  1:30 PM AnOsborne OmanMD [facility]  07/19/2022  3:50 PM Anyanwu, UgSallyanne HaversMD [facility]  08/04/2022  4:10 PM WeDarlina RumpfCNM [facility]  08/18/2022  3:30 PM WeDarlina RumpfCNM [facility]  08/25/2022  3:30 PM PiAletha HalimMD [facility]  08/29/2022  8:30 AM ScEinar PheasantMD [facility]  08/31/2022  3:30 PM PrDonnamae JudeMD [facility]    TaDonnamae JudeMD

## 2022-06-23 LAB — CBC
Hematocrit: 38.1 % (ref 34.0–46.6)
Hemoglobin: 12.7 g/dL (ref 11.1–15.9)
MCH: 31.1 pg (ref 26.6–33.0)
MCHC: 33.3 g/dL (ref 31.5–35.7)
MCV: 93 fL (ref 79–97)
Platelets: 178 10*3/uL (ref 150–450)
RBC: 4.08 x10E6/uL (ref 3.77–5.28)
RDW: 13 % (ref 11.7–15.4)
WBC: 9.2 10*3/uL (ref 3.4–10.8)

## 2022-06-23 LAB — RPR: RPR Ser Ql: NONREACTIVE

## 2022-06-23 LAB — HIV ANTIBODY (ROUTINE TESTING W REFLEX): HIV Screen 4th Generation wRfx: NONREACTIVE

## 2022-06-23 LAB — GLUCOSE TOLERANCE, 2 HOURS W/ 1HR
Glucose, 1 hour: 114 mg/dL (ref 70–179)
Glucose, 2 hour: 76 mg/dL (ref 70–152)
Glucose, Fasting: 70 mg/dL (ref 70–91)

## 2022-07-04 ENCOUNTER — Ambulatory Visit (INDEPENDENT_AMBULATORY_CARE_PROVIDER_SITE_OTHER): Payer: 59 | Admitting: Obstetrics & Gynecology

## 2022-07-04 ENCOUNTER — Encounter: Payer: Self-pay | Admitting: Obstetrics & Gynecology

## 2022-07-04 VITALS — BP 127/84 | HR 71 | Wt 217.0 lb

## 2022-07-04 DIAGNOSIS — O99210 Obesity complicating pregnancy, unspecified trimester: Secondary | ICD-10-CM

## 2022-07-04 DIAGNOSIS — O99213 Obesity complicating pregnancy, third trimester: Secondary | ICD-10-CM

## 2022-07-04 DIAGNOSIS — Z3A29 29 weeks gestation of pregnancy: Secondary | ICD-10-CM

## 2022-07-04 DIAGNOSIS — Z3403 Encounter for supervision of normal first pregnancy, third trimester: Secondary | ICD-10-CM

## 2022-07-04 NOTE — Progress Notes (Signed)
PRENATAL VISIT NOTE  Subjective:  Cheyenne Beck is a 26 y.o. G1P0 at [redacted]w[redacted]d being seen today for ongoing prenatal care.  She is currently monitored for the following issues for this low-risk pregnancy and has Encounter for supervision of normal first pregnancy; Obesity in pregnancy, antepartum; Transient hypertension of pregnancy in second trimester; BMI 31.0-31.9,adult; and Rubella non-immune status, antepartum on their problem list.  Patient reports no complaints.  Contractions: Not present. Vag. Bleeding: None.  Movement: Present. Denies leaking of fluid.   The following portions of the patient's history were reviewed and updated as appropriate: allergies, current medications, past family history, past medical history, past social history, past surgical history and problem list.   Objective:   Vitals:   07/04/22 1330  BP: 127/84  Pulse: 71  Weight: 217 lb (98.4 kg)    Fetal Status: Fetal Heart Rate (bpm): 135 Fundal Height: 31 cm Movement: Present     General:  Alert, oriented and cooperative. Patient is in no acute distress.  Skin: Skin is warm and dry. No rash noted.   Cardiovascular: Normal heart rate noted  Respiratory: Normal respiratory effort, no problems with respiration noted  Abdomen: Soft, gravid, appropriate for gestational age.  Pain/Pressure: Absent     Pelvic: Cervical exam deferred        Extremities: Normal range of motion.  Edema: None  Mental Status: Normal mood and affect. Normal behavior. Normal judgment and thought content.   Imaging: [place] MFM OB FOLLOW UP  Result Date: 06/20/2022 ----------------------------------------------------------------------  OBSTETRICS REPORT                       (Signed Final 06/20/2022 01:19 pm) ---------------------------------------------------------------------- Patient Info  ID #:       [id number]                          D.O.B.:  30-Apr-1996 (26 yrs)  Name:       Cheyenne Beck                 Visit Date: 06/20/2022 01:01 pm  ---------------------------------------------------------------------- Performed By  Attending:        Noralee Space MD        Ref. Address:     [address]                                                             [address]  Performed By:     Cyndie Mull          [facility]  Referred By:      [facility] ---------------------------------------------------------------------- Orders  #  Description                           Code        Ordered By  1  [place] MFM OB FOLLOW UP                   [phone]    Braxton Feathers ----------------------------------------------------------------------  #  Order #                     Accession #                Episode #  1  [id number]                   [id number]                 [id number] ---------------------------------------------------------------------- Indications  Obesity complicating pregnancy, second         O99.212  trimester (BMI 31)  [redacted] weeks gestation of pregnancy                Z3A.27  LR NIPS/Neg AFP/Neg Horizon ---------------------------------------------------------------------- Vital Signs  BP:          134/73 ---------------------------------------------------------------------- Fetal Evaluation  Num Of Fetuses:         1  Fetal Heart Rate(bpm):  132  Cardiac Activity:       Observed  Presentation:           Cephalic  Placenta:               Anterior  P. Cord Insertion:      Previously Visualized                              Largest Pocket(cm)                              4.4 ---------------------------------------------------------------------- Biometry  BPD:      73.1  mm     G. Age:  29w 2d         89  %    CI:        77.63   %    70 - 86                                                          FL/HC:      19.0   %     18.8 - 20.6  HC:      262.6  mm     G. Age:  28w 4d         53  %    HC/AC:      1.15  1.05 - 1.21  AC:      227.4  mm     G. Age:  27w 1d         28  %    FL/BPD:     68.3   %    71 - 87  FL:       49.9  mm     G. Age:  26w 6d         17  %    FL/AC:      21.9   %    20 - 24  Est. FW:    1056  gm      2 lb 5 oz     28  % ---------------------------------------------------------------------- OB History  Gravidity:    1         Term:   0        Prem:   0        SAB:   0  TOP:          0       Ectopic:  0        Living: 0 ---------------------------------------------------------------------- Gestational Age  LMP:           27w 4d        Date:  12/09/21                   EDD:   09/15/22  U/S Today:     28w 0d                                        EDD:   09/12/22  Best:          27w 4d     Det. By:  LMP  (12/09/21)          EDD:   09/15/22 ---------------------------------------------------------------------- Anatomy  Cranium:               Appears normal         LVOT:                   Previously seen  Cavum:                 Appears normal         Aortic Arch:            Previously seen  Ventricles:            Previously seen        Ductal Arch:            Appears normal  Choroid Plexus:        Previously seen        Diaphragm:              Previously seen  Cerebellum:            Previously seen        Stomach:                Appears normal, left  sided  Posterior Fossa:       Previously seen        Abdomen:                Appears normal  Nuchal Fold:           Previously seen        Abdominal Wall:         Previously seen  Face:                  Profile previously     Cord Vessels:           Previously seen                         seen  Lips:                  Previously seen        Kidneys:                Appear normal  Palate:                Not well visualized    Bladder:                Appears normal  Thoracic:              Appears normal          Spine:                  Previously seen  Heart:                 Appears normal; EIF    Upper Extremities:      Previously seen  RVOT:                  Previously seen        Lower Extremities:      Previously seen  Other:  Fetus prev appears to be female. Technically difficult due to fetal          position. 3VV and 3VTV previously visualized. Nasal bone, lenses,          maxilla, mandible and falx previously visualized. ---------------------------------------------------------------------- Cervix Uterus Adnexa  Adnexa  No abnormality visualized. ---------------------------------------------------------------------- Impression  Fetal growth is appropriate for gestational age .Amniotic fluid  is normal and good fetal activity is seen .  Patient will be undergoing screening for gestational diabetes  this week. ---------------------------------------------------------------------- Recommendations  Follow-up scans as clinically indicated. ----------------------------------------------------------------------                  Noralee Space, MD Electronically Signed Final Report   06/20/2022 01:19 pm ----------------------------------------------------------------------   Assessment and Plan:  Pregnancy: G1P0 at [redacted]w[redacted]d 1. Obesity in pregnancy, antepartum TWG 19 lbs, doing well.  2. [redacted] weeks gestation of pregnancy 3. Encounter for supervision of normal first pregnancy in third trimester Normal 2 hr GTT and labs, reviewed with patient.  Preterm labor symptoms and general obstetric precautions including but not limited to vaginal bleeding, contractions, leaking of fluid and fetal movement were reviewed in detail with the patient. Please refer to After Visit Summary for other counseling recommendations.   Return in about 2 weeks (around 07/18/2022) for OFFICE OB VISIT (MD or APP).  Future Appointments  Date Time Provider Department Center  07/19/2022  3:50 PM Whitley Strycharz, Jethro Bastos, MD [facility]   08/04/2022  4:10 PM Calvert Cantor, CNM  [facility]  08/18/2022  3:30 PM Calvert CantorWeinhold, Samantha C, CNM [facility]  08/25/2022  3:30 PM [place] BingPickens, Charlie, MD [facility]  08/29/2022  8:30 AM Dale DurhamScott, Charlene, MD [facility]  08/31/2022  3:30 PM Reva BoresPratt, Tanya S, MD [facility]    Jaynie CollinsUgonna Quintarius Ferns, MD

## 2022-07-04 NOTE — Patient Instructions (Signed)
Return to office for any scheduled appointments. Call the office or go to the MAU at [facility] if: You begin to have strong, frequent contractions Your water breaks.  Sometimes it is a big gush of fluid, sometimes it is just a trickle that keeps getting your underwear wet or running down your legs You have vaginal bleeding.  It is normal to have a small amount of spotting if your cervix was checked.  You do not feel your baby moving like normal.  If you do not, get something to eat and drink and lay down and focus on feeling your baby move.   If your baby is still not moving like normal, you should call the office or go to MAU. Any other obstetric concerns.  

## 2022-07-19 ENCOUNTER — Encounter: Payer: Self-pay | Admitting: Obstetrics & Gynecology

## 2022-07-19 ENCOUNTER — Ambulatory Visit (INDEPENDENT_AMBULATORY_CARE_PROVIDER_SITE_OTHER): Payer: 59 | Admitting: Obstetrics & Gynecology

## 2022-07-19 VITALS — BP 135/88 | HR 85 | Wt 221.0 lb

## 2022-07-19 DIAGNOSIS — Z3403 Encounter for supervision of normal first pregnancy, third trimester: Secondary | ICD-10-CM

## 2022-07-19 DIAGNOSIS — Z3A31 31 weeks gestation of pregnancy: Secondary | ICD-10-CM

## 2022-07-19 NOTE — Patient Instructions (Signed)
Return to office for any scheduled appointments. Call the office or go to the MAU at [facility] if: You begin to have strong, frequent contractions Your water breaks.  Sometimes it is a big gush of fluid, sometimes it is just a trickle that keeps getting your underwear wet or running down your legs You have vaginal bleeding.  It is normal to have a small amount of spotting if your cervix was checked.  You do not feel your baby moving like normal.  If you do not, get something to eat and drink and lay down and focus on feeling your baby move.   If your baby is still not moving like normal, you should call the office or go to MAU. Any other obstetric concerns.  

## 2022-07-19 NOTE — Progress Notes (Signed)
   PRENATAL VISIT NOTE  Subjective:  Andrea A Camuso is a 26 y.o. G1P0 at [redacted]w[redacted]d being seen today for ongoing prenatal care.  She is currently monitored for the following issues for this low-risk pregnancy and has Encounter for supervision of normal first pregnancy in third trimester; Obesity in pregnancy, antepartum; Transient hypertension of pregnancy in second trimester; BMI 31.0-31.9,adult; and Rubella non-immune status, antepartum on their problem list.  Patient reports no complaints.  Contractions: Not present. Vag. Bleeding: None.  Movement: Present. Denies leaking of fluid.   The following portions of the patient's history were reviewed and updated as appropriate: allergies, current medications, past family history, past medical history, past social history, past surgical history and problem list.   Objective:   Vitals:   07/19/22 1601  BP: 135/88  Pulse: 85  Weight: 221 lb (100.2 kg)    Fetal Status: Fetal Heart Rate (bpm): 140 Fundal Height: 34 cm Movement: Present     General:  Alert, oriented and cooperative. Patient is in no acute distress.  Skin: Skin is warm and dry. No rash noted.   Cardiovascular: Normal heart rate noted  Respiratory: Normal respiratory effort, no problems with respiration noted  Abdomen: Soft, gravid, appropriate for gestational age.  Pain/Pressure: Present     Pelvic: Cervical exam deferred        Extremities: Normal range of motion.  Edema: None  Mental Status: Normal mood and affect. Normal behavior. Normal judgment and thought content.   Assessment and Plan:  Pregnancy: G1P0 at [redacted]w[redacted]d 1. [redacted] weeks gestation of pregnancy 2. Encounter for supervision of normal first pregnancy in third trimester Borderline BP, no symptoms. Continue to monitor. Continue to monitor fundal height, may consider ultrasound if still elevated next visit.  Normal third trimester labs, reviewed with patient.  Preterm labor symptoms and general obstetric precautions  including but not limited to vaginal bleeding, contractions, leaking of fluid and fetal movement were reviewed in detail with the patient. Please refer to After Visit Summary for other counseling recommendations.   Return in about 2 weeks (around 08/02/2022) for OFFICE OB VISIT (MD or APP).  Future Appointments  Date Time Provider Department Center  08/04/2022  4:10 PM Calvert Cantor, [place] [facility]  08/18/2022  3:30 PM Calvert Cantor, CNM [facility]  08/25/2022  3:30 PM Leakey Bing, MD [facility]  08/29/2022  8:30 AM Dale [place], MD [facility]  08/31/2022  3:30 PM Reva Bores, MD [facility]    Jaynie Collins, MD

## 2022-08-04 ENCOUNTER — Ambulatory Visit (INDEPENDENT_AMBULATORY_CARE_PROVIDER_SITE_OTHER): Payer: 59 | Admitting: Advanced Practice Midwife

## 2022-08-04 VITALS — BP 144/89 | HR 88 | Wt 222.0 lb

## 2022-08-04 DIAGNOSIS — O99210 Obesity complicating pregnancy, unspecified trimester: Secondary | ICD-10-CM

## 2022-08-04 DIAGNOSIS — Z3A34 34 weeks gestation of pregnancy: Secondary | ICD-10-CM

## 2022-08-04 DIAGNOSIS — R030 Elevated blood-pressure reading, without diagnosis of hypertension: Secondary | ICD-10-CM

## 2022-08-04 DIAGNOSIS — Z3403 Encounter for supervision of normal first pregnancy, third trimester: Secondary | ICD-10-CM

## 2022-08-04 DIAGNOSIS — O99213 Obesity complicating pregnancy, third trimester: Secondary | ICD-10-CM

## 2022-08-04 NOTE — Progress Notes (Signed)
ROB 34w  B/P elevated today.  Denies any HA's, visual changes.

## 2022-08-04 NOTE — Progress Notes (Signed)
   PRENATAL VISIT NOTE  Subjective:  Cheyenne Beck is a 26 y.o. G1P0 at [redacted]w[redacted]d being seen today for ongoing prenatal care.  She is currently monitored for the following issues for this low-risk pregnancy and has Encounter for supervision of normal first pregnancy in third trimester; Obesity in pregnancy, antepartum; Transient hypertension of pregnancy in second trimester; BMI 31.0-31.9,adult; and Rubella non-immune status, antepartum on their problem list.  Patient reports no complaints.  Contractions: Not present. Vag. Bleeding: None.  Movement: Present. Denies leaking of fluid.   The following portions of the patient's history were reviewed and updated as appropriate: allergies, current medications, past family history, past medical history, past social history, past surgical history and problem list. Problem list updated.  Objective:   Vitals:   08/04/22 1605  BP: (!) 144/89  Pulse: 88  Weight: 222 lb (100.7 kg)    Fetal Status: Fetal Heart Rate (bpm): 140   Movement: Present     General:  Alert, oriented and cooperative. Patient is in no acute distress.  Skin: Skin is warm and dry. No rash noted.   Cardiovascular: Normal heart rate noted  Respiratory: Normal respiratory effort, no problems with respiration noted  Abdomen: Soft, gravid, appropriate for gestational age.  Pain/Pressure: Present     Pelvic: Cervical exam deferred        Extremities: Normal range of motion.  Edema: Moderate pitting, indentation subsides rapidly  Mental Status: Normal mood and affect. Normal behavior. Normal judgment and thought content.   Assessment and Plan:  Pregnancy: G1P0 at [redacted]w[redacted]d  1. Encounter for supervision of normal first pregnancy in third trimester - Routine care - FH measuring ahead last visit, appropriate today. Reviewed if she measures ahead next visit we will schedule growth scan - Navigated to [facility] site for childbirth education classes - Consider starting to work on  birth plan, distraction and comfort tools during labor, etc --GBS/GC next visit, preemptive discussion re: indication for swabs, impact on labor course  2. Obesity in pregnancy, antepartum - BMI 34.77, TWG 24 lb  3. Elevated blood pressure reading in office without diagnosis of hypertension - Transient Hypertension noted by Dr. Vergie Living 03/29/2022 (143/83) - Mildly elevated today, no other elevated BPs, asymptomatic - Continue to monitor  4. [redacted] weeks gestation of pregnancy   Preterm labor symptoms and general obstetric precautions including but not limited to vaginal bleeding, contractions, leaking of fluid and fetal movement were reviewed in detail with the patient. Please refer to After Visit Summary for other counseling recommendations.    Future Appointments  Date Time Provider Department Center  08/18/2022  3:30 PM Briant Sites [facility]  08/25/2022  3:30 PM Iatan Bing, MD [facility]  08/29/2022  8:30 AM Dale [place], MD [facility]  08/31/2022  3:30 PM Reva Bores, MD [facility]  09/09/2022 11:15 AM Federico Flake, MD [facility]  09/14/2022  3:50 PM Federico Flake, MD [facility]    Calvert Cantor, CNM

## 2022-08-05 ENCOUNTER — Encounter: Payer: Self-pay | Admitting: Advanced Practice Midwife

## 2022-08-08 NOTE — L&D Delivery Note (Signed)
OB/GYN Faculty Practice Delivery Note  Cheyenne Beck is a 27 y.o. G1P0 s/p SVD at [redacted]w[redacted]d. She was admitted for IOL for gHTN.   ROM: 11h 23m with clear fluid GBS Status:  Negative/-- (01/11 1616) Maximum Maternal Temperature:  Temp (24hrs), Avg:97.9 F (36.6 C), Min:97.6 F (36.4 C), Max:98.4 F (36.9 C)    Labor Progress: Patient arrived at 0 cm dilation and was induced with cytotec, FB, pit, AROM.   Delivery Date/Time: 08/27/2022 at 1734 Delivery: Called to room and patient was complete and pushing. Head delivered in direct OA position. Nuchal cord present and reduced immediately. Shoulder and body delivered in usual fashion. Infant with spontaneous cry, placed on mother's abdomen, dried and stimulated. Cord clamped x 2 after 1-minute delay, and cut by FOB. Cord blood drawn. Placenta delivered spontaneously with gentle cord traction. Fundus firm with massage and Pitocin. Labia, perineum, vagina, and cervix inspected with left labial laceration and 2nd degree perineal laceration both repaired .   Placenta: spontaneous, intact, 3 vessel cord  Complications: none Lacerations: 2nd degree perineal and left labial  EBL: 271 Analgesia: Epidural    Infant: APGAR (1 MIN):  9 APGAR (5 MINS):  9 APGAR (10 MINS):    Weight: Pending  Gifford Shave, MD  OB Fellow  08/27/2022 6:34 PM

## 2022-08-15 ENCOUNTER — Other Ambulatory Visit: Payer: Self-pay | Admitting: *Deleted

## 2022-08-15 ENCOUNTER — Other Ambulatory Visit: Payer: Self-pay | Admitting: Obstetrics and Gynecology

## 2022-08-15 ENCOUNTER — Telehealth: Payer: Self-pay | Admitting: *Deleted

## 2022-08-15 MED ORDER — BUTALBITAL-APAP-CAFFEINE 50-325-40 MG PO CAPS: ORAL_CAPSULE | ORAL | 0 refills | Status: DC

## 2022-08-15 MED ORDER — BUTALBITAL-APAP-CAFFEINE 50-325-40 MG PO CAPS: 1.0000 | ORAL_CAPSULE | Freq: Four times a day (QID) | ORAL | 0 refills | Status: DC | PRN

## 2022-08-15 NOTE — Telephone Encounter (Signed)
Returned pt's husband call, pt has had a headache for about 24 hours with no relief from tylenol. BP is 115/70.  Pt has history of migraines.   Discussed with Dr Ilda Basset, will send in some Fioricet for pt. And if not better to reach back out to office.

## 2022-08-18 ENCOUNTER — Other Ambulatory Visit (HOSPITAL_COMMUNITY)
Admission: RE | Admit: 2022-08-18 | Discharge: 2022-08-18 | Disposition: A | Discharge status: Home / Self Care | Payer: 59 | Source: Ambulatory Visit | Attending: Advanced Practice Midwife | Admitting: Advanced Practice Midwife

## 2022-08-18 ENCOUNTER — Encounter (INDEPENDENT_AMBULATORY_CARE_PROVIDER_SITE_OTHER): Payer: Self-pay | Admitting: *Deleted

## 2022-08-18 ENCOUNTER — Ambulatory Visit: Payer: 59 | Admitting: Advanced Practice Midwife

## 2022-08-18 VITALS — BP 146/87 | HR 105 | Wt 223.0 lb

## 2022-08-18 DIAGNOSIS — O132 Gestational [pregnancy-induced] hypertension without significant proteinuria, second trimester: Secondary | ICD-10-CM

## 2022-08-18 DIAGNOSIS — O133 Gestational [pregnancy-induced] hypertension without significant proteinuria, third trimester: Secondary | ICD-10-CM | POA: Diagnosis not present

## 2022-08-18 DIAGNOSIS — Z3402 Encounter for supervision of normal first pregnancy, second trimester: Secondary | ICD-10-CM | POA: Insufficient documentation

## 2022-08-18 DIAGNOSIS — Z3A36 36 weeks gestation of pregnancy: Secondary | ICD-10-CM | POA: Diagnosis not present

## 2022-08-18 NOTE — Patient Instructions (Signed)

## 2022-08-19 NOTE — Progress Notes (Deleted)
   PRENATAL VISIT NOTE  Subjective:  Cheyenne Beck is a 27 y.o. G1P0 at [redacted]w[redacted]d being seen today for ongoing prenatal care.  She is currently monitored for the following issues for this low-risk pregnancy and has Encounter for supervision of normal first pregnancy in third trimester; Obesity in pregnancy, antepartum; Transient hypertension of pregnancy in second trimester; BMI 31.0-31.9,adult; and Rubella non-immune status, antepartum on their problem list.  Patient reports no complaints.  Contractions: Irritability. Vag. Bleeding: None.  Movement: Present. Denies leaking of fluid.   The following portions of the patient's history were reviewed and updated as appropriate: allergies, current medications, past family history, past medical history, past social history, past surgical history and problem list. Problem list updated.  Objective:   Vitals:   08/18/22 1533  BP: (!) 146/87  Pulse: (!) 105  Weight: 223 lb (101.2 kg)    Fetal Status: Fetal Heart Rate (bpm): 129   Movement: Present     General:  Alert, oriented and cooperative. Patient is in no acute distress.  Skin: Skin is warm and dry. No rash noted.   Cardiovascular: Normal heart rate noted  Respiratory: Normal respiratory effort, no problems with respiration noted  Abdomen: Soft, gravid, appropriate for gestational age.  Pain/Pressure: Absent     Pelvic: Cervical exam deferred        Extremities: Normal range of motion.  Edema: Trace  Mental Status: Normal mood and affect. Normal behavior. Normal judgment and thought content.   Assessment and Plan:  Pregnancy: G1P0 at [redacted]w[redacted]d  1. Encounter for supervision of normal first pregnancy in second trimester *** - Cervicovaginal ancillary only( [place]) - Strep Gp B NAA  2. [redacted] weeks gestation of pregnancy ***  {Blank single:19197::"Term","Preterm"} labor symptoms and general obstetric precautions including but not limited to vaginal bleeding, contractions, leaking of fluid  and fetal movement were reviewed in detail with the patient. Please refer to After Visit Summary for other counseling recommendations.  Return in about 1 week (around 08/25/2022) for MD or APP.  Future Appointments  Date Time Provider [place]  08/25/2022  3:30 PM Aletha Halim, MD [facility]  08/29/2022  8:30 AM Einar Pheasant, MD [facility]  08/31/2022  3:30 PM Donnamae Jude, MD [facility]  09/09/2022 11:15 AM Caren Macadam, MD [facility]  09/14/2022  3:50 PM Caren Macadam, MD [facility]    Darlina Rumpf, CNM

## 2022-08-19 NOTE — Progress Notes (Signed)
   PRENATAL VISIT NOTE  Subjective:  Cheyenne Beck is a 27 y.o. G1P0 at [redacted]w[redacted]d being seen today for ongoing prenatal care.  She is currently monitored for the following issues for this low-risk pregnancy and has Encounter for supervision of normal first pregnancy in third trimester; Obesity in pregnancy, antepartum; Transient hypertension of pregnancy in second trimester; BMI 31.0-31.9,adult; and Rubella non-immune status, antepartum on their problem list.  Patient reports no complaints.  Contractions: Irritability. Vag. Bleeding: None.  Movement: Present. Denies leaking of fluid.   The following portions of the patient's history were reviewed and updated as appropriate: allergies, current medications, past family history, past medical history, past social history, past surgical history and problem list. Problem list updated.  Objective:   Vitals:   08/18/22 1533  BP: (!) 146/87  Pulse: (!) 105  Weight: 223 lb (101.2 kg)    Fetal Status: Fetal Heart Rate (bpm): 129   Movement: Present     General:  Alert, oriented and cooperative. Patient is in no acute distress.  Skin: Skin is warm and dry. No rash noted.   Cardiovascular: Normal heart rate noted  Respiratory: Normal respiratory effort, no problems with respiration noted  Abdomen: Soft, gravid, appropriate for gestational age.  Pain/Pressure: Absent     Pelvic: Cervical exam deferred        Extremities: Normal range of motion.  Edema: Trace  Mental Status: Normal mood and affect. Normal behavior. Normal judgment and thought content.   Assessment and Plan:  Pregnancy: G1P0 at [redacted]w[redacted]d  1. Encounter for supervision of normal first pregnancy in second trimester - Routine care - Cervicovaginal ancillary only( [place]) - Strep Gp B NAA  2. Transient hypertension of pregnancy in second trimester - Existing diagnosis, previously documented by Dr. Ilda Basset  - Mildly elevated today. No indication for labs at this time - Continue to  monitor  3. [redacted] weeks gestation of pregnancy   Preterm labor symptoms and general obstetric precautions including but not limited to vaginal bleeding, contractions, leaking of fluid and fetal movement were reviewed in detail with the patient. Please refer to After Visit Summary for other counseling recommendations.  Return in about 1 week (around 08/25/2022) for MD or APP.  Future Appointments  Date Time Provider [place]  08/25/2022  3:30 PM Aletha Halim, MD [facility]  08/29/2022  8:30 AM Einar Pheasant, MD [facility]  08/31/2022  3:30 PM Donnamae Jude, MD [facility]  09/09/2022 11:15 AM Caren Macadam, MD [facility]  09/14/2022  3:50 PM Caren Macadam, MD [facility]    Darlina Rumpf, CNM

## 2022-08-20 LAB — STREP GP B NAA: Strep Gp B NAA: NEGATIVE

## 2022-08-21 LAB — CERVICOVAGINAL ANCILLARY ONLY
Chlamydia: NEGATIVE
Comment: NEGATIVE
Comment: NORMAL
Neisseria Gonorrhea: NEGATIVE

## 2022-08-25 ENCOUNTER — Other Ambulatory Visit: Payer: Self-pay

## 2022-08-25 ENCOUNTER — Encounter (HOSPITAL_COMMUNITY): Payer: Self-pay | Admitting: Obstetrics & Gynecology

## 2022-08-25 ENCOUNTER — Ambulatory Visit (INDEPENDENT_AMBULATORY_CARE_PROVIDER_SITE_OTHER): Payer: 59 | Admitting: Obstetrics and Gynecology

## 2022-08-25 ENCOUNTER — Inpatient Hospital Stay (HOSPITAL_COMMUNITY)
Admission: RE | Admit: 2022-08-25 | Discharge: 2022-08-29 | DRG: 807 | Disposition: A | Discharge status: Home / Self Care | Payer: 59 | Attending: Family Medicine | Admitting: Family Medicine

## 2022-08-25 VITALS — BP 146/93 | HR 86 | Wt 226.0 lb

## 2022-08-25 DIAGNOSIS — O134 Gestational [pregnancy-induced] hypertension without significant proteinuria, complicating childbirth: Secondary | ICD-10-CM | POA: Diagnosis not present

## 2022-08-25 DIAGNOSIS — Z3A37 37 weeks gestation of pregnancy: Secondary | ICD-10-CM

## 2022-08-25 DIAGNOSIS — O99214 Obesity complicating childbirth: Secondary | ICD-10-CM | POA: Diagnosis not present

## 2022-08-25 DIAGNOSIS — O99210 Obesity complicating pregnancy, unspecified trimester: Secondary | ICD-10-CM

## 2022-08-25 DIAGNOSIS — Z23 Encounter for immunization: Secondary | ICD-10-CM | POA: Diagnosis not present

## 2022-08-25 DIAGNOSIS — Z8759 Personal history of other complications of pregnancy, childbirth and the puerperium: Secondary | ICD-10-CM | POA: Insufficient documentation

## 2022-08-25 DIAGNOSIS — O133 Gestational [pregnancy-induced] hypertension without significant proteinuria, third trimester: Secondary | ICD-10-CM | POA: Diagnosis not present

## 2022-08-25 DIAGNOSIS — O99213 Obesity complicating pregnancy, third trimester: Secondary | ICD-10-CM

## 2022-08-25 DIAGNOSIS — Z3403 Encounter for supervision of normal first pregnancy, third trimester: Secondary | ICD-10-CM

## 2022-08-25 DIAGNOSIS — O139 Gestational [pregnancy-induced] hypertension without significant proteinuria, unspecified trimester: Secondary | ICD-10-CM | POA: Insufficient documentation

## 2022-08-25 DIAGNOSIS — Z6831 Body mass index (BMI) 31.0-31.9, adult: Secondary | ICD-10-CM

## 2022-08-25 DIAGNOSIS — Z2839 Other underimmunization status: Secondary | ICD-10-CM

## 2022-08-25 DIAGNOSIS — O09899 Supervision of other high risk pregnancies, unspecified trimester: Secondary | ICD-10-CM

## 2022-08-25 LAB — PROTEIN / CREATININE RATIO, URINE
Creatinine, Urine: 152 mg/dL
Protein Creatinine Ratio: 0.16 mg/mg{Cre} — ABNORMAL HIGH (ref 0.00–0.15)
Total Protein, Urine: 25 mg/dL

## 2022-08-25 LAB — COMPREHENSIVE METABOLIC PANEL
ALT: 21 U/L (ref 0–44)
AST: 26 U/L (ref 15–41)
Albumin: 2.7 g/dL — ABNORMAL LOW (ref 3.5–5.0)
Alkaline Phosphatase: 90 U/L (ref 38–126)
Anion gap: 10 (ref 5–15)
BUN: 7 mg/dL (ref 6–20)
CO2: 17 mmol/L — ABNORMAL LOW (ref 22–32)
Calcium: 8.3 mg/dL — ABNORMAL LOW (ref 8.9–10.3)
Chloride: 109 mmol/L (ref 98–111)
Creatinine, Ser: 0.6 mg/dL (ref 0.44–1.00)
GFR, Estimated: >60 mL/min (ref >60)
Glucose, Bld: 154 mg/dL — ABNORMAL HIGH (ref 70–99)
Potassium: 2.9 mmol/L — ABNORMAL LOW (ref 3.5–5.1)
Sodium: 136 mmol/L (ref 135–145)
Total Bilirubin: 0.5 mg/dL (ref 0.3–1.2)
Total Protein: 5.9 g/dL — ABNORMAL LOW (ref 6.5–8.1)

## 2022-08-25 LAB — CBC
HCT: 34.1 % — ABNORMAL LOW (ref 36.0–46.0)
Hemoglobin: 12.5 g/dL (ref 12.0–15.0)
MCH: 31.6 pg (ref 26.0–34.0)
MCHC: 36.7 g/dL — ABNORMAL HIGH (ref 30.0–36.0)
MCV: 86.3 fL (ref 80.0–100.0)
Platelets: 164 10*3/uL (ref 150–400)
RBC: 3.95 MIL/uL (ref 3.87–5.11)
RDW: 12.3 % (ref 11.5–15.5)
WBC: 9.2 10*3/uL (ref 4.0–10.5)
nRBC: 0 % (ref 0.0–0.2)

## 2022-08-25 LAB — TYPE AND SCREEN
ABO/RH(D): B POS
Antibody Screen: NEGATIVE

## 2022-08-25 MED ORDER — PHENYLEPHRINE 80 MCG/ML (10ML) SYRINGE FOR IV PUSH (FOR BLOOD PRESSURE SUPPORT): 80.0000 ug | PREFILLED_SYRINGE | INTRAVENOUS | Status: DC | PRN

## 2022-08-25 MED ORDER — OXYTOCIN BOLUS FROM INFUSION
333.0000 mL | Freq: Once | INTRAVENOUS | Status: AC
  Administered 2022-08-27: 333 mL via INTRAVENOUS

## 2022-08-25 MED ORDER — DIPHENHYDRAMINE HCL 50 MG/ML IJ SOLN: 12.5000 mg | INTRAMUSCULAR | Status: DC | PRN

## 2022-08-25 MED ORDER — SOD CITRATE-CITRIC ACID 500-334 MG/5ML PO SOLN: 30.0000 mL | ORAL | Status: DC | PRN

## 2022-08-25 MED ORDER — ONDANSETRON HCL 4 MG/2ML IJ SOLN: 4.0000 mg | Freq: Four times a day (QID) | INTRAMUSCULAR | Status: DC | PRN

## 2022-08-25 MED ORDER — LIDOCAINE HCL (PF) 1 % IJ SOLN: 30.0000 mL | INTRAMUSCULAR | Status: DC | PRN

## 2022-08-25 MED ORDER — TERBUTALINE SULFATE 1 MG/ML IJ SOLN: 0.2500 mg | Freq: Once | INTRAMUSCULAR | Status: DC | PRN

## 2022-08-25 MED ORDER — MISOPROSTOL 25 MCG QUARTER TABLET
25.0000 ug | ORAL_TABLET | Freq: Once | ORAL | Status: DC
  Filled 2022-08-25: qty 1

## 2022-08-25 MED ORDER — LACTATED RINGERS IV SOLN: 500.0000 mL | INTRAVENOUS | Status: DC | PRN

## 2022-08-25 MED ORDER — ACETAMINOPHEN 325 MG PO TABS: 650.0000 mg | ORAL_TABLET | ORAL | Status: DC | PRN

## 2022-08-25 MED ORDER — ZOLPIDEM TARTRATE 5 MG PO TABS: 5.0000 mg | ORAL_TABLET | Freq: Every evening | ORAL | Status: DC | PRN

## 2022-08-25 MED ORDER — MISOPROSTOL 50MCG HALF TABLET: 50.0000 ug | ORAL_TABLET | Freq: Once | ORAL | Status: DC

## 2022-08-25 MED ORDER — FENTANYL CITRATE (PF) 100 MCG/2ML IJ SOLN
100.0000 ug | INTRAMUSCULAR | Status: DC | PRN
  Administered 2022-08-26 – 2022-08-27 (×2): 100 ug via INTRAVENOUS
  Filled 2022-08-25 (×2): qty 2

## 2022-08-25 MED ORDER — OXYCODONE-ACETAMINOPHEN 5-325 MG PO TABS: 1.0000 | ORAL_TABLET | ORAL | Status: DC | PRN

## 2022-08-25 MED ORDER — MISOPROSTOL 50MCG HALF TABLET
50.0000 ug | ORAL_TABLET | Freq: Once | ORAL | Status: DC
  Filled 2022-08-25: qty 1

## 2022-08-25 MED ORDER — OXYCODONE-ACETAMINOPHEN 5-325 MG PO TABS: 2.0000 | ORAL_TABLET | ORAL | Status: DC | PRN

## 2022-08-25 MED ORDER — EPHEDRINE 5 MG/ML INJ: 10.0000 mg | INTRAVENOUS | Status: DC | PRN

## 2022-08-25 MED ORDER — MISOPROSTOL 25 MCG QUARTER TABLET
25.0000 ug | ORAL_TABLET | ORAL | Status: DC
  Administered 2022-08-25 – 2022-08-26 (×3): 25 ug via BUCCAL
  Filled 2022-08-25 (×2): qty 1

## 2022-08-25 MED ORDER — LACTATED RINGERS IV SOLN: 500.0000 mL | Freq: Once | INTRAVENOUS | Status: DC

## 2022-08-25 MED ORDER — FENTANYL-BUPIVACAINE-NACL 0.5-0.125-0.9 MG/250ML-% EP SOLN
12.0000 mL/h | EPIDURAL | Status: DC | PRN
  Administered 2022-08-27: 12 mL/h via EPIDURAL
  Filled 2022-08-25: qty 250

## 2022-08-25 MED ORDER — OXYTOCIN-SODIUM CHLORIDE 30-0.9 UT/500ML-% IV SOLN: 2.5000 [IU]/h | INTRAVENOUS | Status: DC

## 2022-08-25 MED ORDER — LACTATED RINGERS IV SOLN: INTRAVENOUS | Status: DC

## 2022-08-25 NOTE — Progress Notes (Signed)
ROB   Pt would like cervix check.   Will get assistance with FHT's

## 2022-08-25 NOTE — Progress Notes (Addendum)
   PRENATAL VISIT NOTE  Subjective:  Cheyenne Beck is a 27 y.o. G1P0 at [redacted]w[redacted]d being seen today for ongoing prenatal care.  She is currently monitored for the following issues for this high-risk pregnancy and has Encounter for supervision of normal first pregnancy in third trimester; Obesity in pregnancy, antepartum; Transient hypertension of pregnancy in second trimester; BMI 31.0-31.9,adult; and Rubella non-immune status, antepartum on their problem list.  Patient reports no complaints.  Contractions: Irritability. Vag. Bleeding: None.  Movement: Present. Denies leaking of fluid.   The following portions of the patient's history were reviewed and updated as appropriate: allergies, current medications, past family history, past medical history, past social history, past surgical history and problem list.   Objective:   Vitals:   08/25/22 1535 08/25/22 1603  BP: (!) 146/101 (!) 146/93  Pulse: 86   Weight: 226 lb (102.5 kg)     Fetal Status:     Movement: Present     General:  Alert, oriented and cooperative. Patient is in no acute distress.  Skin: Skin is warm and dry. No rash noted.   Cardiovascular: Normal heart rate noted  Respiratory: Normal respiratory effort, no problems with respiration noted  Abdomen: Soft, gravid, appropriate for gestational age.        Pelvic: Cervical exam deferred        Extremities: Normal range of motion.  Edema: Trace  Mental Status: Normal mood and affect. Normal behavior. Normal judgment and thought content.   Assessment and Plan:  Pregnancy: G1P0 at [redacted]w[redacted]d 1. Gestational hypertension, third trimester Mild range BPs the last two weeks and mildly elevated x 2 today. NST reactive (baseline 110s-115s) Pt set up for IOL for today. Pt to go home and them go to L&D. GBS neg - Fetal nonstress test  2. Obesity in pregnancy, antepartum  3. BMI 31.0-31.9,adult  4. [redacted] weeks gestation of pregnancy - Fetal nonstress test  Term labor symptoms and  general obstetric precautions including but not limited to vaginal bleeding, contractions, leaking of fluid and fetal movement were reviewed in detail with the patient. Please refer to After Visit Summary for other counseling recommendations.   No follow-ups on file.  Future Appointments  Date Time Provider [place]  08/29/2022  8:30 AM Einar Pheasant, MD [facility]  08/31/2022  3:30 PM Donnamae Jude, MD [facility]  09/09/2022 11:15 AM Caren Macadam, MD [facility]  09/14/2022  3:50 PM Caren Macadam, MD [facility]    Aletha Halim, MD

## 2022-08-25 NOTE — H&P (Addendum)
OBSTETRIC ADMISSION HISTORY AND PHYSICAL  Cheyenne Beck is a 27 y.o. female G1P0 with IUP at [redacted]w[redacted]d by LMP presenting for IOL secondary to [facility], diagnosed today. She reports +FMs, No LOF, no VB, no blurry vision, headaches or peripheral edema, and RUQ pain.  She plans on breast feeding. She request POPs for birth control. She received her prenatal care at  [facility].    Dating: By LMP --->  Estimated Date of Delivery: 09/15/22  [place]ono 04/21/22:    @[redacted]w[redacted]d , CWD, normal anatomy, breech presentation,  250g, 26% EFW   Prenatal History/Complications: Obesity in pregnancy, Gestational HTN; BMI 31.0-31.9,adult; and Rubella non-immune status        Nursing Staff Provider  Office Location  [place] Dating  LMP  [facility] Model [ X] Traditional [ ]  Centering [ ]  Mom-Baby Dyad      Language  english Anatomy US  WNL  Flu Vaccine  Received at work 10/23 at [place]:   Low risk AFP:   wnl Horizon: Neg x 4  TDaP Vaccine   06/22/22 Hgb A1C or  GTT Early A1C 4.9 Third trimester 70/114/76  COVID Vaccine 1 doses   LAB RESULTS   Rhogam    Blood Type B/Positive/-- (07/25 1054)   Baby Feeding Plan Breast Antibody Negative (07/25 1054)  Contraception Pill Rubella <0.90 (07/25 1054) nonimmune  Circumcision Yes if boy RPR Non Reactive (07/25 1054)   Pediatrician  [place] p[facility] HBsAg Negative (07/25 1054)   Support Person FOB-Cheyenne Beck HCVAb Negative (07/25 1054)  Prenatal Classes No HIV Non Reactive (07/25 1054)         GBS Neg      Pap 11/22/21 normal           DME Rx [x]  BP cuff [ ]  Weight Scale      PHQ9 & GAD7 [ x] [name] [ x] 28 weeks  [  ] 36 weeks Induction  [ ]  Orders Entered [ ] Foley Y/N    Past Medical History: Past Medical History:  Diagnosis Date   Breast nodule 01/08/2015   Headache     Past Surgical History: Past Surgical History:  Procedure Laterality Date   NO PAST SURGERIES      Obstetrical History: OB History     Gravida  1   Para      Term      Preterm       AB      Living         SAB      IAB      Ectopic      Multiple      Live Births              Social History Social History   Socioeconomic History   Marital status: Married    Spouse name: Not on file   Number of children: Not on file   Years of education: Not on file   Highest education level: Not on file  Occupational History   Not on file  Tobacco Use   Smoking status: Never   Smokeless tobacco: Never  Vaping Use   Vaping Use: Never used  Substance and Sexual Activity   Alcohol use: No    Alcohol/week: 0.0 standard drinks of alcohol   Drug use: No   Sexual activity: Yes    Partners: Male    Birth control/protection: None    Comment: Husband  Other Topics Concern   Not on file  Social History Narrative   Not on file   Social Determinants of Health   Financial Resource Strain: Not on file  Food Insecurity: Not on file  Transportation Needs: Not on file  Physical Activity: Not on file  Stress: Not on file  Social Connections: Not on file    Family History: Family History  Problem Relation Age of Onset   Hypertension Mother    Diabetes Mother    Hyperlipidemia Father    Hypertension Father    Breast cancer Other    Colon cancer Maternal Grandmother    Arthritis Maternal Grandfather    Kidney disease Maternal Grandfather    Arthritis Paternal Grandmother    Heart disease Paternal Grandmother    Diabetes Paternal Grandmother     Allergies: No Known Allergies  Medications Prior to Admission  Medication Sig Dispense Refill Last Dose   aspirin EC 81 MG tablet Take 1 tablet (81 mg total) by mouth daily. Take after 12 weeks for prevention of preeclampsia later in pregnancy 300 tablet 2    Butalbital-APAP-Caffeine 50-325-40 MG capsule Take one tablet; if not better in 8 hours after first dose, can repeat with 2 tabs 4 capsule 0    magnesium oxide (MAG-OX) 400 (240 Mg) MG tablet TAKE 1 TABLET BY MOUTH EVERY DAY 90 tablet 0    Prenatal  Vit-Fe Fumarate-FA (MULTIVITAMIN-PRENATAL) 27-0.8 MG TABS tablet Take 1 tablet by mouth daily at 12 noon.        Review of Systems   All systems reviewed and negative except as stated in HPI  Last menstrual period 12/09/2021. General appearance: alert, cooperative, and no distress Lungs: clear to auscultation bilaterally Heart: regular rate and rhythm Abdomen: soft, non-tender; bowel sounds normal Pelvic: cx closed/50/-2/midposition, moderate consistency Extremities: Homans sign is negative, no sign of DVT DTR's 2+ Presentation: cephalic Fetal monitoringBaseline: 140 bpm, Variability: Good {> 6 bpm), Accelerations: Reactive, and Decelerations: Absent Uterine activityNone     Prenatal labs: ABO, Rh: B/Positive/-- (07/25 1054) Antibody: Negative (07/25 1054) Rubella: <0.90 (07/25 1054) RPR: Non Reactive (11/15 0854)  HBsAg: Negative (07/25 1054)  HIV: Non Reactive (11/15 0854)  GBS: Negative/-- (01/11 1616)  2 hr Glucola Nml  Genetic screening  nml Anatomy US nml  Prenatal Transfer Tool  Maternal Diabetes: No Genetic Screening: Normal Maternal Ultrasounds/Referrals: Normal Fetal Ultrasounds or other Referrals:  None Maternal Substance Abuse:  No Significant Maternal Medications:  ASA Significant Maternal Lab Results:  Group B Strep negative, rubella nonimmune Number of Prenatal Visits:greater than 3 verified prenatal visits  No results found for this or any previous visit (from the past 24 hour(s)).  Patient Active Problem List   Diagnosis Date Noted   Normal labor 08/25/2022   Rubella non-immune status, antepartum 06/22/2022   Transient hypertension of pregnancy in second trimester 03/29/2022   BMI 31.0-31.9,adult 03/29/2022   Obesity in pregnancy, antepartum 03/01/2022   Encounter for supervision of normal first pregnancy in third trimester 02/07/2022    Assessment/Plan:  Cheyenne Beck is a 27 y.o. G1P0 at [redacted]w[redacted]d here for induction of labor secondary to  elevated Bps.   #Labor:cytotec 50/80mcg (continue 20mcg bucally q 4 hours prn)>foley>pitocin>AROM #Pain: epidural #FWB: Cat 1 #ID:  GBS -, rubella nonimmune #MOF: Breast #MOC: POPs #Gestational HTN: monitor BPs  Abram Sander, MD  08/25/2022, 7:51 PM Christin Fudge, CNM

## 2022-08-26 LAB — RPR: RPR Ser Ql: NONREACTIVE

## 2022-08-26 MED ORDER — OXYTOCIN-SODIUM CHLORIDE 30-0.9 UT/500ML-% IV SOLN
1.0000 m[IU]/min | INTRAVENOUS | Status: DC
  Administered 2022-08-26: 2 m[IU]/min via INTRAVENOUS
  Filled 2022-08-26: qty 500

## 2022-08-26 MED ORDER — MISOPROSTOL 50MCG HALF TABLET
50.0000 ug | ORAL_TABLET | Freq: Once | ORAL | Status: AC
  Administered 2022-08-26: 50 ug via ORAL

## 2022-08-26 MED ORDER — MISOPROSTOL 25 MCG QUARTER TABLET
25.0000 ug | ORAL_TABLET | ORAL | Status: DC
  Administered 2022-08-26: 25 ug via VAGINAL
  Filled 2022-08-26: qty 1

## 2022-08-26 MED ORDER — TERBUTALINE SULFATE 1 MG/ML IJ SOLN: 0.2500 mg | Freq: Once | INTRAMUSCULAR | Status: DC | PRN

## 2022-08-26 MED ORDER — MISOPROSTOL 50MCG HALF TABLET
50.0000 ug | ORAL_TABLET | ORAL | Status: DC
  Administered 2022-08-26: 50 ug via BUCCAL
  Filled 2022-08-26: qty 1

## 2022-08-26 NOTE — Progress Notes (Signed)
Cheyenne Beck is a 27 y.o. G1P0 at [redacted]w[redacted]d by LMP admitted for induction of labor due to Gestational HTN.   Subjective: Pt is starting to feel the contractions more strongly.   Objective: BP 127/70   Pulse 80   Temp 97.6 F (36.4 C) (Oral)   Resp 16   Ht 5\' 7"  (1.702 m)   Wt 103 kg   LMP 12/09/2021   BMI 35.57 kg/m  No intake/output data recorded. No intake/output data recorded.  FHT:  FHR: 110 bpm, variability: moderate,  accelerations:  Present,  decelerations:  Absent UC:   irregular SVE:   Dilation: Fingertip Effacement (%): 60, 70 Station: -2 Exam by:: stone rnc  Labs: Lab Results  Component Value Date   WBC 9.2 08/25/2022   HGB 12.5 08/25/2022   HCT 34.1 (L) 08/25/2022   MCV 86.3 08/25/2022   PLT 164 08/25/2022    Assessment / Plan: Induction of labor due to gestational hypertension, Cytotec first administered at 0830, foley balloon placed at 12:30. Foley balloon still present, will administer another dose of Cytotec to ripen the cervix more.   Labor: After balloon falls off will AROM and start pit  Gestational HTN: will continue to monitor BPs Fetal Wellbeing:  Category I Pain Control:  Epidural I/D:   GBS- Anticipated MOD:  NSVD  Abram Sander, MD 08/26/2022, 6:22 PM

## 2022-08-26 NOTE — Progress Notes (Signed)
Labor Progress Note  Cheyenne Beck is a 27 y.o. G1P0 at [redacted]w[redacted]d presented for IOL gHTN  S: Pt just woke up from nap  O:  BP (!) 141/78   Pulse 69   Temp 97.7 F (36.5 C) (Oral)   Resp 17   Ht 5\' 7"  (1.702 m)   Wt 103 kg   LMP 12/09/2021   BMI 35.57 kg/m  EFM: 130 bpm/Moderate variability/ 15x15 accels/ None decels  CVE: Dilation: Fingertip Effacement (%): 60, 70 Station: -2 Presentation: Vertex Exam by:: stone rnc   A&P: 27 y.o. G1P0 [redacted]w[redacted]d  here for IOL as above  #Labor: Progressing well. FB traction, still in place. Start low dose pitocin 2x2 until 17mi/min reached #Pain: Nitrous oxide, Family/Friend support, PO/IV pain meds, and Epidural #FWB: CAT 1 #GBS negative #gHTN: BP elevated, no severe ranged. PreE labs nml. CTM  Shelda Pal, DO [place] Fellow, Faculty practice [place] for [place] 08/26/22  9:18 PM

## 2022-08-26 NOTE — Progress Notes (Signed)
Cheyenne Beck is a 27 y.o. G1P0 at [redacted]w[redacted]d by LMP admitted for induction of labor due to Gestational HTN.   Subjective: Pt is feeling well, but not yet feeling the contractions very strongly.   Objective: BP 129/88   Pulse 73   Temp (!) 97.5 F (36.4 C) (Oral)   Resp 18   Ht 5\' 7"  (1.702 m)   Wt 103 kg   LMP 12/09/2021   BMI 35.57 kg/m  No intake/output data recorded. No intake/output data recorded.  FHT:  FHR: 110 bpm, variability: moderate,  accelerations:  Present,  decelerations:  Absent UC:   irregular SVE:   Dilation: Fingertip Effacement (%): 60, 70 Station: -2 Exam by:: stone rnc  Labs: Lab Results  Component Value Date   WBC 9.2 08/25/2022   HGB 12.5 08/25/2022   HCT 34.1 (L) 08/25/2022   MCV 86.3 08/25/2022   PLT 164 08/25/2022    Assessment / Plan: Induction of labor due to gestational hypertension, Cytotec first administered at 0830, foley balloon placed at 12:30. Will administer another dose of cytotec bucally and recheck after her balloon falls out.   Labor:  Gestational HTN: will continue to monitor BPs Fetal Wellbeing:  Category I Pain Control:  Epidural I/D:   GBS- Anticipated MOD:  NSVD  Abram Sander, MD 08/26/2022, 12:32 PM

## 2022-08-27 ENCOUNTER — Encounter (HOSPITAL_COMMUNITY): Payer: Self-pay | Admitting: Obstetrics & Gynecology

## 2022-08-27 ENCOUNTER — Inpatient Hospital Stay (HOSPITAL_COMMUNITY): Payer: 59 | Admitting: Anesthesiology

## 2022-08-27 DIAGNOSIS — O99214 Obesity complicating childbirth: Secondary | ICD-10-CM

## 2022-08-27 DIAGNOSIS — O139 Gestational [pregnancy-induced] hypertension without significant proteinuria, unspecified trimester: Secondary | ICD-10-CM | POA: Insufficient documentation

## 2022-08-27 DIAGNOSIS — Z3A37 37 weeks gestation of pregnancy: Secondary | ICD-10-CM

## 2022-08-27 DIAGNOSIS — O134 Gestational [pregnancy-induced] hypertension without significant proteinuria, complicating childbirth: Secondary | ICD-10-CM

## 2022-08-27 DIAGNOSIS — Z8759 Personal history of other complications of pregnancy, childbirth and the puerperium: Secondary | ICD-10-CM | POA: Insufficient documentation

## 2022-08-27 HISTORY — DX: Gestational (pregnancy-induced) hypertension without significant proteinuria, unspecified trimester: O13.9

## 2022-08-27 LAB — CBC
HCT: 34.9 % — ABNORMAL LOW (ref 36.0–46.0)
HCT: 36.6 % (ref 36.0–46.0)
Hemoglobin: 12.4 g/dL (ref 12.0–15.0)
Hemoglobin: 12.5 g/dL (ref 12.0–15.0)
MCH: 30.9 pg (ref 26.0–34.0)
MCH: 30.3 pg (ref 26.0–34.0)
MCHC: 35.5 g/dL (ref 30.0–36.0)
MCHC: 34.2 g/dL (ref 30.0–36.0)
MCV: 87 fL (ref 80.0–100.0)
MCV: 88.8 fL (ref 80.0–100.0)
Platelets: 151 10*3/uL (ref 150–400)
Platelets: 165 10*3/uL (ref 150–400)
RBC: 4.01 MIL/uL (ref 3.87–5.11)
RBC: 4.12 MIL/uL (ref 3.87–5.11)
RDW: 12.5 % (ref 11.5–15.5)
RDW: 12.5 % (ref 11.5–15.5)
WBC: 14.1 10*3/uL — ABNORMAL HIGH (ref 4.0–10.5)
WBC: 19.4 10*3/uL — ABNORMAL HIGH (ref 4.0–10.5)
nRBC: 0 % (ref 0.0–0.2)
nRBC: 0 % (ref 0.0–0.2)

## 2022-08-27 MED ORDER — ACETAMINOPHEN 325 MG PO TABS
650.0000 mg | ORAL_TABLET | ORAL | Status: DC | PRN
  Administered 2022-08-28: 650 mg via ORAL
  Filled 2022-08-27: qty 2

## 2022-08-27 MED ORDER — BENZOCAINE-MENTHOL 20-0.5 % EX AERO
1.0000 | INHALATION_SPRAY | CUTANEOUS | Status: DC | PRN
  Filled 2022-08-27: qty 56

## 2022-08-27 MED ORDER — COCONUT OIL OIL
1.0000 | TOPICAL_OIL | Status: DC | PRN
  Administered 2022-08-29: 1 via TOPICAL

## 2022-08-27 MED ORDER — LIDOCAINE HCL (PF) 1 % IJ SOLN
INTRAMUSCULAR | Status: DC | PRN
  Administered 2022-08-27 (×2): 4 mL via EPIDURAL

## 2022-08-27 MED ORDER — METHYLERGONOVINE MALEATE 0.2 MG/ML IJ SOLN
INTRAMUSCULAR | Status: AC
  Filled 2022-08-27: qty 1

## 2022-08-27 MED ORDER — EPHEDRINE 5 MG/ML INJ: 10.0000 mg | INTRAVENOUS | Status: DC | PRN

## 2022-08-27 MED ORDER — SIMETHICONE 80 MG PO CHEW: 80.0000 mg | CHEWABLE_TABLET | ORAL | Status: DC | PRN

## 2022-08-27 MED ORDER — WITCH HAZEL-GLYCERIN EX PADS: 1.0000 | MEDICATED_PAD | CUTANEOUS | Status: DC | PRN

## 2022-08-27 MED ORDER — DIPHENHYDRAMINE HCL 50 MG/ML IJ SOLN: 12.5000 mg | INTRAMUSCULAR | Status: DC | PRN

## 2022-08-27 MED ORDER — IBUPROFEN 600 MG PO TABS
600.0000 mg | ORAL_TABLET | Freq: Four times a day (QID) | ORAL | Status: DC
  Administered 2022-08-27 – 2022-08-29 (×6): 600 mg via ORAL
  Filled 2022-08-27 (×8): qty 1

## 2022-08-27 MED ORDER — PRENATAL MULTIVITAMIN CH
1.0000 | ORAL_TABLET | Freq: Every day | ORAL | Status: DC
  Administered 2022-08-28 – 2022-08-29 (×2): 1 via ORAL
  Filled 2022-08-27 (×2): qty 1

## 2022-08-27 MED ORDER — SENNOSIDES-DOCUSATE SODIUM 8.6-50 MG PO TABS
2.0000 | ORAL_TABLET | ORAL | Status: DC
  Administered 2022-08-28: 2 via ORAL
  Filled 2022-08-27 (×2): qty 2

## 2022-08-27 MED ORDER — FUROSEMIDE 20 MG PO TABS
20.0000 mg | ORAL_TABLET | Freq: Every day | ORAL | Status: DC
  Administered 2022-08-28 – 2022-08-29 (×2): 20 mg via ORAL
  Filled 2022-08-27 (×2): qty 1

## 2022-08-27 MED ORDER — ONDANSETRON HCL 4 MG PO TABS: 4.0000 mg | ORAL_TABLET | ORAL | Status: DC | PRN

## 2022-08-27 MED ORDER — TETANUS-DIPHTH-ACELL PERTUSSIS 5-2.5-18.5 LF-MCG/0.5 IM SUSY: 0.5000 mL | PREFILLED_SYRINGE | Freq: Once | INTRAMUSCULAR | Status: DC

## 2022-08-27 MED ORDER — DIPHENHYDRAMINE HCL 25 MG PO CAPS: 25.0000 mg | ORAL_CAPSULE | Freq: Four times a day (QID) | ORAL | Status: DC | PRN

## 2022-08-27 MED ORDER — ZOLPIDEM TARTRATE 5 MG PO TABS: 5.0000 mg | ORAL_TABLET | Freq: Every evening | ORAL | Status: DC | PRN

## 2022-08-27 MED ORDER — FENTANYL-BUPIVACAINE-NACL 0.5-0.125-0.9 MG/250ML-% EP SOLN: 12.0000 mL/h | EPIDURAL | Status: DC | PRN

## 2022-08-27 MED ORDER — LACTATED RINGERS IV SOLN: 500.0000 mL | Freq: Once | INTRAVENOUS | Status: DC

## 2022-08-27 MED ORDER — PHENYLEPHRINE 80 MCG/ML (10ML) SYRINGE FOR IV PUSH (FOR BLOOD PRESSURE SUPPORT): 80.0000 ug | PREFILLED_SYRINGE | INTRAVENOUS | Status: DC | PRN

## 2022-08-27 MED ORDER — TRANEXAMIC ACID-NACL 1000-0.7 MG/100ML-% IV SOLN
INTRAVENOUS | Status: AC
  Filled 2022-08-27: qty 100

## 2022-08-27 MED ORDER — LACTATED RINGERS AMNIOINFUSION
300.0000 mL | Freq: Once | INTRAVENOUS | Status: AC
  Administered 2022-08-27: 300 mL via INTRAUTERINE

## 2022-08-27 MED ORDER — TRANEXAMIC ACID-NACL 1000-0.7 MG/100ML-% IV SOLN: 1000.0000 mg | INTRAVENOUS | Status: DC

## 2022-08-27 MED ORDER — ONDANSETRON HCL 4 MG/2ML IJ SOLN: 4.0000 mg | INTRAMUSCULAR | Status: DC | PRN

## 2022-08-27 MED ORDER — DIBUCAINE (PERIANAL) 1 % EX OINT: 1.0000 | TOPICAL_OINTMENT | CUTANEOUS | Status: DC | PRN

## 2022-08-27 NOTE — Progress Notes (Signed)
Labor Progress Note  Cheyenne Beck is a 27 y.o. G1P0 at [redacted]w[redacted]d presented for IOL gHTN  S: Pt doing well. Feeling her contractions more  O:  BP 127/70 (BP Location: Left Arm)   Pulse 62   Temp 97.6 F (36.4 C) (Oral)   Resp 17   Ht 5\' 7"  (1.702 m)   Wt 103 kg   LMP 12/09/2021   BMI 35.57 kg/m  EFM: 130 bpm/Moderate variability/ 15x15 accels/ None decels  CVE: Dilation: 5 Effacement (%): 50 Cervical Position: Middle Station: -3 Presentation: Vertex (Verified by [place] per Dr. Dorena Cookey) Exam by:: Dr. Dorena Cookey   A&P: 27 y.o. G1P0 [redacted]w[redacted]d  here for IOL as above  #Labor: Progressing well. AROM clear, cont pit 2x2 #Pain: Nitrous oxide, Family/Friend support, PO/IV pain meds, and Epidural #FWB: CAT 1 #GBS negative #gHTN: BP elevated, no severe ranged. PreE labs nml. CTM  Shelda Pal, DO [place] Fellow, Faculty practice [place] for [facility] 08/27/22  6:24 AM

## 2022-08-27 NOTE — Progress Notes (Signed)
Labor Progress Note  Cheyenne Beck is a 27 y.o. G1P0 at [redacted]w[redacted]d presented for IOL gHTN  S: Comfortable with epidural in place   O:  BP 127/68   Pulse 76   Temp 98.4 F (36.9 C) (Oral)   Resp 16   Ht 5\' 7"  (1.702 m)   Wt 103 kg   LMP 12/09/2021   SpO2 100%   BMI 35.57 kg/m  EFM: 140 bpm/Moderate variability/ 15x15 accels/ occasional variable decels  CVE: Dilation: 10 Dilation Complete Date: 08/27/22 Dilation Complete Time: 1614 Effacement (%): 100 Cervical Position: Middle Station: Plus 2 Presentation: Vertex (Verified by [place] per Dr. Dorena Cookey) Exam by:: Kathrine Haddock   A&P: 27 y.o. G1P0 [redacted]w[redacted]d  here for IOL as above  #Labor: Progressing well. 6- 7 cm at this check. 0 to -1 station. FHT having variable decelerations so IUPC placed and amnioinfusion started. Will continue to monitor #Pain: Epidural in place  #FWB: CAT 2, cut pitocin to 8 and started amnioinfusion  #GBS negative #gHTN: BP elevated, no severe ranged. PreE labs nml. CTM  Concepcion Living, MD [place] Fellow, Faculty practice [place] for [place] 08/27/22  4:31 PM

## 2022-08-27 NOTE — Anesthesia Procedure Notes (Signed)
Epidural Patient location during procedure: OB Start time: 08/27/2022 11:26 AM End time: 08/27/2022 11:29 AM  Staffing Anesthesiologist: Brennan Bailey, MD Performed: anesthesiologist   Preanesthetic Checklist Completed: patient identified, IV checked, risks and benefits discussed, monitors and equipment checked, pre-op evaluation and timeout performed  Epidural Patient position: sitting Prep: DuraPrep and site prepped and draped Patient monitoring: continuous pulse ox, blood pressure and heart rate Approach: midline Location: L3-L4 Injection technique: LOR air  Needle:  Needle type: Tuohy  Needle gauge: 17 G Needle length: 9 cm Needle insertion depth: 6 cm Catheter type: closed end flexible Catheter size: 19 Gauge Catheter at skin depth: 11 cm Test dose: negative and Other (1% lidocaine)  Assessment Events: blood not aspirated, no cerebrospinal fluid, injection not painful, no injection resistance, no paresthesia and negative IV test  Additional Notes Patient identified. Risks, benefits, and alternatives discussed with patient including but not limited to bleeding, infection, nerve damage, paralysis, failed block, incomplete pain control, headache, blood pressure changes, nausea, vomiting, reactions to medication, itching, and postpartum back pain. Confirmed with bedside nurse the patient's most recent platelet count. Confirmed with patient that they are not currently taking any anticoagulation, have any bleeding history, or any family history of bleeding disorders. Patient expressed understanding and wished to proceed. All questions were answered. Sterile technique was used throughout the entire procedure. Please see nursing notes for vital signs.   Crisp LOR on first pass. Test dose was given through epidural catheter and negative prior to continuing to dose epidural or start infusion. Warning signs of high block given to the patient including shortness of breath,  tingling/numbness in hands, complete motor block, or any concerning symptoms with instructions to call for help. Patient was given instructions on fall risk and not to get out of bed. All questions and concerns addressed with instructions to call with any issues or inadequate analgesia.  Reason for block:procedure for pain

## 2022-08-27 NOTE — Discharge Summary (Signed)
Postpartum Discharge Summary  Date of Service updated    Patient Name: Cheyenne Beck DOB: Nov 08, 1995 MRN: [id number]  Date of admission: 08/25/2022 Delivery date:08/27/2022  Delivering provider: Concepcion Living  Date of discharge: 08/29/2022  Admitting diagnosis: Normal labor [O80, Z37.9] Intrauterine pregnancy: [redacted]w[redacted]d     Secondary diagnosis:  Principal Problem:   Normal labor Active Problems:   Encounter for supervision of normal first pregnancy in third trimester   Obesity in pregnancy, antepartum   Rubella non-immune status, antepartum   Gestational hypertension  Additional problems: None    Discharge diagnosis: Term Pregnancy Delivered and Gestational Hypertension                                              Post partum procedures: None Augmentation: AROM, Pitocin, Cytotec, and IP Foley Complications: None  Hospital course: Induction of Labor With Vaginal Delivery   27 y.o. yo G1P0 at [redacted]w[redacted]d was admitted to the hospital 08/25/2022 for induction of labor.  Indication for induction: Gestational hypertension.  Patient had an uncomplicated labor course.  Membrane Rupture Time/Date: 6:21 AM ,08/27/2022   Delivery Method:Vaginal, Spontaneous  Episiotomy:  N/A Lacerations:  2nd degree;Perineal;Labial  Details of delivery can be found in separate delivery note.  Patient had a postpartum course complicated by gestational hypertension however her blood pressures were normal following delivery. She was placed on lasix per standard protocol for our practice On PPD2 she was meeting all goals for postpartum. She had some back cramps but controlled with PO medication. Patient is discharged home 08/29/22.  Newborn Data: Birth date:08/27/2022  Birth time:5:34 PM  Gender:Female  Living status:Living  Apgars:8 ,9  Weight:2770 g   Magnesium Sulfate received: No BMZ received: No Rhophylac:N/A MMR: Recommended prior to discharge.  T-DaP:Given prenatally Flu: Given  prenatally Transfusion:No  Physical exam  Vitals:   08/28/22 0300 08/28/22 1515 08/28/22 2311 08/29/22 0746  BP: (!) 104/55 132/83 111/73 126/69  Pulse: 66 81 69 77  Resp: 18 18 18    Temp: (!) 94.6 F (34.8 C) 98 F (36.7 C) 98.2 F (36.8 C) 98 F (36.7 C)  TempSrc: Axillary Oral Oral Oral  SpO2:   99% 100%  Weight:      Height:       General: alert, cooperative, and no distress Lochia: appropriate Uterine Fundus: firm Incision: N/A DVT Evaluation: No evidence of DVT seen on physical exam. Labs: Lab Results  Component Value Date   WBC 19.4 (H) 08/27/2022   HGB 12.5 08/27/2022   HCT 36.6 08/27/2022   MCV 88.8 08/27/2022   PLT 165 08/27/2022      Latest Ref Rng & Units 08/25/2022    8:00 PM  CMP  Glucose 70 - 99 mg/dL 154   BUN 6 - 20 mg/dL 7   Creatinine 0.44 - 1.00 mg/dL 0.60   Sodium 135 - 145 mmol/L 136   Potassium 3.5 - 5.1 mmol/L 2.9   Chloride 98 - 111 mmol/L 109   CO2 22 - 32 mmol/L 17   Calcium 8.9 - 10.3 mg/dL 8.3   Total Protein 6.5 - 8.1 g/dL 5.9   Total Bilirubin 0.3 - 1.2 mg/dL 0.5   Alkaline Phos 38 - 126 U/L 90   AST 15 - 41 U/L 26   ALT 0 - 44 U/L 21    Edinburgh Score:  08/28/2022    8:30 AM  Edinburgh Postnatal Depression Scale Screening Tool  I have been able to laugh and see the funny side of things. 0  I have looked forward with enjoyment to things. 0  I have blamed myself unnecessarily when things went wrong. 1  I have been anxious or worried for no good reason. 1  I have felt scared or panicky for no good reason. 0  Things have been getting on top of me. 1  I have been so unhappy that I have had difficulty sleeping. 0  I have felt sad or miserable. 0  I have been so unhappy that I have been crying. 0  The thought of harming myself has occurred to me. 0  Edinburgh Postnatal Depression Scale Total 3     After visit meds:  Allergies as of 08/29/2022   No Known Allergies      Medication List     STOP taking these  medications    aspirin EC 81 MG tablet   Butalbital-APAP-Caffeine 50-325-40 MG capsule   magnesium oxide 400 (240 Mg) MG tablet Commonly known as: MAG-OX       TAKE these medications    acetaminophen 325 MG tablet Commonly known as: Tylenol Take 2 tablets (650 mg total) by mouth every 4 (four) hours as needed (for pain scale < 4).   furosemide 20 MG tablet Commonly known as: LASIX Take 1 tablet (20 mg total) by mouth daily for 3 days. Start taking on: August 30, 2022   ibuprofen 600 MG tablet Commonly known as: ADVIL Take 1 tablet (600 mg total) by mouth every 6 (six) hours.   multivitamin-prenatal 27-0.8 MG Tabs tablet Take 1 tablet by mouth daily at 12 noon.   norethindrone 0.35 MG tablet Commonly known as: MICRONOR Take 1 tablet (0.35 mg total) by mouth daily.         Discharge home in stable condition Infant Feeding: Breast Infant Disposition: Likely home with mother but may room in one night.  Discharge instruction: per After Visit Summary and Postpartum booklet. Activity: Advance as tolerated. Pelvic rest for 6 weeks.  Diet: routine diet Future Appointments: Future Appointments  Date Time Provider [place]  09/02/2022 10:45 AM [facility] NURSE [facility]  10/03/2022  3:50 PM Aletha Halim, MD [facility]   Follow up Visit:  [place] [facility] Follow up in 1 week(s).   Specialty: Obstetrics and Gynecology Why: Blood pressure check. Contact information: [place] [phone]               Message sent   Please schedule this patient for a In person postpartum visit in 4 weeks with the following provider: Any provider. Additional Postpartum F/U:BP check 1 week  High risk pregnancy complicated by: HTN Delivery mode:  Vaginal, Spontaneous  Anticipated Birth Control:  POPs   08/29/2022 Radene Gunning,  MD

## 2022-08-27 NOTE — Lactation Note (Signed)
This note was copied from a baby's chart. Lactation Consultation Note  Patient Name: Cheyenne Beck [id number] Date: 08/27/2022   Age:28 hours  LC called the RN to see if the birth parent would like to be seen by lactation. The RN stated she was unsure and would call the [facility] to let her know.    [place] 08/27/2022, 6:24 PM

## 2022-08-27 NOTE — Anesthesia Preprocedure Evaluation (Signed)
Anesthesia Evaluation  Patient identified by MRN, date of birth, ID band Patient awake    Reviewed: Allergy & Precautions, Patient's Chart, lab work & pertinent test results  History of Anesthesia Complications Negative for: history of anesthetic complications  Airway Mallampati: II  TM Distance: >3 FB Neck ROM: Full    Dental no notable dental hx.    Pulmonary neg pulmonary ROS   Pulmonary exam normal        Cardiovascular hypertension (gestational), Normal cardiovascular exam     Neuro/Psych  Headaches    GI/Hepatic negative GI ROS, Neg liver ROS,,,  Endo/Other  negative endocrine ROS    Renal/GU negative Renal ROS  negative genitourinary   Musculoskeletal negative musculoskeletal ROS (+)    Abdominal   Peds  Hematology negative hematology ROS (+)   Anesthesia Other Findings Day of surgery medications reviewed with patient.  Reproductive/Obstetrics (+) Pregnancy                             Anesthesia Physical Anesthesia Plan  ASA: 2  Anesthesia Plan: Epidural   Post-op Pain Management:    Induction:   PONV Risk Score and Plan: Treatment may vary due to age or medical condition  Airway Management Planned: Natural Airway  Additional Equipment: Fetal Monitoring  Intra-op Plan:   Post-operative Plan:   Informed Consent: I have reviewed the patients History and Physical, chart, labs and discussed the procedure including the risks, benefits and alternatives for the proposed anesthesia with the patient or authorized representative who has indicated his/her understanding and acceptance.       Plan Discussed with:   Anesthesia Plan Comments:        Anesthesia Quick Evaluation

## 2022-08-27 NOTE — Lactation Note (Addendum)
This note was copied from a baby's chart. Lactation Consultation Note  Patient Name: Cheyenne Beck Date: 08/27/2022   Age:28 hours  LC entered the room and the birth parent was holding the infant.  LC assisted the birth parent with putting the infant to the right breast in the cross-cradle position.  LC spoke with the birth parent about support pillows, proper body alignment, bringing the infant to the breast, turning the infant on her side, and sandwiching the tissue.  [place] educated the parents on feeding cues and feeding frequency.  The infant latched deeply and took a few sucks before falling asleep. She was off and on for 5 min before falling asleep at the breast. LC assisted the birth parent with putting the infant STS. All questions were answered.  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  [place] congratulated the parents and exited the room.   Infant Feeding Plan:  Breastfeed 8+ times in 24 hours according to feeding cues.  Hand express and spoon feed the infant expressed milk. Call [place] for assistance with breastfeeding.   Maternal Data    Feeding    LATCH Score                    Lactation Tools Discussed/Used    Interventions    Discharge    Consult Status      Cheyenne Beck 08/27/2022, 8:52 PM

## 2022-08-28 MED ORDER — MEASLES, MUMPS & RUBELLA VAC IJ SOLR
0.5000 mL | Freq: Once | INTRAMUSCULAR | Status: AC
  Administered 2022-08-29: 0.5 mL via SUBCUTANEOUS
  Filled 2022-08-28: qty 0.5

## 2022-08-28 NOTE — Anesthesia Postprocedure Evaluation (Signed)
Anesthesia Post Note  Patient: Cheyenne Beck  Procedure(s) Performed: AN AD [place]     Patient location during evaluation: Mother Baby Anesthesia Type: Epidural Level of consciousness: awake and alert and oriented Pain management: satisfactory to patient Vital Signs Assessment: post-procedure vital signs reviewed and stable Respiratory status: respiratory function stable Cardiovascular status: stable Postop Assessment: no headache, no backache, epidural receding, patient able to bend at knees, no signs of nausea or vomiting, adequate PO intake and able to ambulate Anesthetic complications: no   No notable events documented.  Last Vitals:  Vitals:   08/28/22 0022 08/28/22 0300  BP: (!) 103/59 (!) 104/55  Pulse: 79 66  Resp: 18 18  Temp: 36.7 C (!) 34.8 C  SpO2:      Last Pain:  Vitals:   08/28/22 0401  TempSrc:   PainSc: 0-No pain   Pain Goal:                   [name]

## 2022-08-28 NOTE — Plan of Care (Signed)
  Problem: Education: Goal: Knowledge of General Education information will improve Description: Including pain rating scale, medication(s)/side effects and non-pharmacologic comfort measures Outcome: Completed/Met

## 2022-08-28 NOTE — Lactation Note (Signed)
This note was copied from a baby's chart. Lactation Consultation Note  Patient Name: [name] [id number] Date: 08/28/2022 Reason for consult: Follow-up assessment;Mother's request;Difficult latch;Primapara;1st time breastfeeding;Early term 37-38.6wks;Infant weight loss;Breastfeeding assistance (1.99% WL) Age:28 hours  LC entered the room and the support person was holding the infant.  Per the birth parent, the infant had a hard time latching last night.  She stated that the night time RN suggested supplementing with formula.  LC suggested using a nipple shield.  The infant latched on the right breast in the cross-cradle position for 10 min.  Her lips were flanged, tongue was down, she was sucking with stimulation, and jaw extensions were noted.  There was milk noted in the shield when the infant came off the breast.  She was switched to the left and latched with her tongue down, lips flanged, sucking was rhythmic, and jaw extensions were noted.  Milk was noted in the shield.  The parents stated that this was the best the infant has done at the breast so far.  LC will return to the room later to help the birth parent get her employee breast pump.   Feeding Mother's Current Feeding Choice: Breast Milk and Formula  LATCH Score Latch: Grasps breast easily, tongue down, lips flanged, rhythmical sucking.  Audible Swallowing: Spontaneous and intermittent  Type of Nipple: Flat  Comfort (Breast/Nipple): Soft / non-tender  Hold (Positioning): Assistance needed to correctly position infant at breast and maintain latch.  LATCH Score: 8   Lactation Tools Discussed/Used Tools: Nipple Shields Nipple shield size: 16  Interventions Interventions: Breast feeding basics reviewed;Assisted with latch;Adjust position;Support pillows;Education;LC Services brochure  Discharge    Consult Status Consult Status: Follow-up Date: 08/29/22 Follow-up type: In-patient    Elly [place]  Dashonda Bonneau 08/28/2022, 1:11 PM

## 2022-08-28 NOTE — Progress Notes (Signed)
Post Partum Day 1 Subjective: no complaints, up ad lib, voiding, and tolerating PO  Objective: Blood pressure (!) 104/55, pulse 66, temperature (!) 94.6 F (34.8 C), temperature source Axillary, resp. rate 18, height 5\' 7"  (1.702 m), weight 103 kg, last menstrual period 12/09/2021, SpO2 100 %, unknown if currently breastfeeding.  Physical Exam:  General: alert, cooperative, and appears stated age 27: appropriate Uterine Fundus: firm DVT Evaluation: No evidence of DVT seen on physical exam.  Recent Labs    08/27/22 0752 08/27/22 1842  HGB 12.4 12.5  HCT 34.9* 36.6    Assessment/Plan: Plan for discharge tomorrow, Breastfeeding, Lactation consult, and Contraception POPs Lasix M2B BBRx written MMR pp   LOS: 3 days   Donnamae Jude, MD 08/28/2022, 8:55 AM

## 2022-08-29 ENCOUNTER — Encounter: Payer: 59 | Admitting: Internal Medicine

## 2022-08-29 ENCOUNTER — Other Ambulatory Visit (HOSPITAL_COMMUNITY): Payer: Self-pay

## 2022-08-29 MED ORDER — FUROSEMIDE 20 MG PO TABS
20.0000 mg | ORAL_TABLET | Freq: Every day | ORAL | 0 refills | Status: DC
  Filled 2022-08-29: qty 3, 3d supply, fill #0

## 2022-08-29 MED ORDER — ACETAMINOPHEN 325 MG PO TABS
650.0000 mg | ORAL_TABLET | ORAL | 0 refills | Status: AC | PRN
  Filled 2022-08-29: qty 30, 3d supply, fill #0

## 2022-08-29 MED ORDER — IBUPROFEN 600 MG PO TABS
600.0000 mg | ORAL_TABLET | Freq: Four times a day (QID) | ORAL | 0 refills | Status: DC
  Filled 2022-08-29: qty 30, 8d supply, fill #0

## 2022-08-29 MED ORDER — NORETHINDRONE 0.35 MG PO TABS
1.0000 | ORAL_TABLET | Freq: Every day | ORAL | 3 refills | Status: DC
  Filled 2022-08-29: qty 84, 84d supply, fill #0

## 2022-08-29 NOTE — Lactation Note (Signed)
This note was copied from a baby's chart. Lactation Consultation Note  Patient Name: Cheyenne Beck [id number] Date: 08/29/2022 Reason for consult: Follow-up assessment;Primapara;1st time breastfeeding;Early term 37-38.6wks Age:28 hours   P1: Early term infant at 37+2 weeks Feeding preference: Breast (Mother will supplement until baby is able to exclusively                                   breast feed) Weight loss: 3%  Mother attempted to breast feed at 77, however, "[name]" was not interested.  Mother supplemented with 23 mls of Similac Neosure 22 calorie formula.  She will attempt to breast feed at the next feeding and will continue to latch prior to supplementation after discharge.  "[name]" has consumed appropriate volumes; voiding/stooling.    Encouraged to continue to feed at least every three hours or sooner if baby shows cues.  Suggested mother follow up with a lactation consultant In her pediatrician's office in extra help is needed.  Mother believes there is a Optometrist on staff.  If not, I discussed our op lactation services; handout in parent's room.  Mother has received her employee pump.  Pediatrician will provide formula at discharge.  Parents appreciative.   Maternal Data    Feeding Mother's Current Feeding Choice: Breast Milk and Formula  LATCH Score                    Lactation Tools Discussed/Used    Interventions    Discharge Discharge Education: Engorgement and breast care Pump: Employee Pump (Pump delivered to mother's room)  Consult Status Consult Status: Complete Date: 08/29/22 Follow-up type: Call as needed    [name] 08/29/2022, 11:32 AM

## 2022-08-31 ENCOUNTER — Encounter: Payer: Self-pay | Admitting: Family Medicine

## 2022-09-01 ENCOUNTER — Encounter: Payer: Self-pay | Admitting: Advanced Practice Midwife

## 2022-09-02 ENCOUNTER — Ambulatory Visit (INDEPENDENT_AMBULATORY_CARE_PROVIDER_SITE_OTHER): Payer: 59 | Admitting: *Deleted

## 2022-09-02 VITALS — BP 130/85 | HR 80

## 2022-09-02 DIAGNOSIS — O133 Gestational [pregnancy-induced] hypertension without significant proteinuria, third trimester: Secondary | ICD-10-CM

## 2022-09-02 DIAGNOSIS — O135 Gestational [pregnancy-induced] hypertension without significant proteinuria, complicating the puerperium: Secondary | ICD-10-CM

## 2022-09-02 NOTE — Progress Notes (Signed)
Subjective:  Cheyenne Beck is a 27 y.o. female here for BP check.   Hypertension ROS: Patient denies any headaches, visual symptoms, RUQ/epigastric pain or other concerning symptoms.  Objective:  BP 130/85   Pulse 80   Appearance alert, well appearing, and in no distress. General exam BP noted to be stable today in office.    Assessment:   Blood Pressure stable.  Pt will take her BP at home this weekend send a mychart message  Plan:  Follow up as needed and or pat postpartum visit.  Crosby Oyster, RN

## 2022-09-04 ENCOUNTER — Encounter: Payer: Self-pay | Admitting: Obstetrics and Gynecology

## 2022-09-05 ENCOUNTER — Encounter: Payer: Self-pay | Admitting: *Deleted

## 2022-09-08 ENCOUNTER — Encounter: Payer: Commercial Managed Care - PPO | Admitting: Obstetrics and Gynecology

## 2022-09-09 ENCOUNTER — Encounter: Payer: Commercial Managed Care - PPO | Admitting: Family Medicine

## 2022-09-14 ENCOUNTER — Encounter: Payer: Commercial Managed Care - PPO | Admitting: Family Medicine

## 2022-10-03 ENCOUNTER — Encounter: Payer: Self-pay | Admitting: Obstetrics and Gynecology

## 2022-10-03 ENCOUNTER — Ambulatory Visit (INDEPENDENT_AMBULATORY_CARE_PROVIDER_SITE_OTHER): Payer: 59 | Admitting: Obstetrics and Gynecology

## 2022-10-03 DIAGNOSIS — O135 Gestational [pregnancy-induced] hypertension without significant proteinuria, complicating the puerperium: Secondary | ICD-10-CM

## 2022-10-03 DIAGNOSIS — O139 Gestational [pregnancy-induced] hypertension without significant proteinuria, unspecified trimester: Secondary | ICD-10-CM

## 2022-10-03 MED ORDER — NORETHIN ACE-ETH ESTRAD-FE 1-20 MG-MCG PO TABS: 1.0000 | ORAL_TABLET | Freq: Every day | ORAL | 4 refills | Status: DC

## 2022-10-03 NOTE — Progress Notes (Unsigned)
[place] Partum Visit Note  Cheyenne Beck is a 27 y.o. [id number] female who presents for a postpartum visit. She is 5 weeks postpartum following a normal spontaneous vaginal delivery.  I have fully reviewed the prenatal and intrapartum course. The delivery was at 9 gestational weeks.  Anesthesia: epidural. Postpartum course has been uncomplicated. Baby is doing well. Baby is feeding by bottle - Enfamil Gentlease . Bleeding no bleeding. Bowel function is normal. Bladder function is normal. Patient is not sexually active. Contraception method is OCP (estrogen/progesterone). Postpartum depression screening: EPDS = 8 .   The pregnancy intention screening data noted above was reviewed. Potential methods of contraception were discussed. The patient elected to proceed with No data recorded.   Edinburgh Postnatal Depression Scale - 10/03/22 1602       Edinburgh Postnatal Depression Scale:  In the Past 7 Days   I have been able to laugh and see the funny side of things. 1    I have looked forward with enjoyment to things. 0    I have blamed myself unnecessarily when things went wrong. 1    I have been anxious or worried for no good reason. 2    I have felt scared or panicky for no good reason. 1    Things have been getting on top of me. 1    I have been so unhappy that I have had difficulty sleeping. 0    I have felt sad or miserable. 1    I have been so unhappy that I have been crying. 1    The thought of harming myself has occurred to me. 0    Edinburgh Postnatal Depression Scale Total 8             Health Maintenance Due  Topic Date Due   COVID-19 Vaccine (1) Never done   INFLUENZA VACCINE  03/08/2022    {Common ambulatory SmartLinks:19316}  Review of Systems {ros; complete:30496}  Objective:  There were no vitals taken for this visit.   General:  {gen appearance:16600}   Breasts:  {desc; normal/abnormal/not indicated:14647}  Lungs: {lung exam:16931}  Heart:  {heart  exam:5510}  Abdomen: {abdomen exam:16834}   Wound {Wound assessment:11097}  GU exam:  {desc; normal/abnormal/not indicated:14647}       Assessment:    There are no diagnoses linked to this encounter.  *** postpartum exam.   Plan:   Essential components of care per ACOG recommendations:  1.  Mood and well being: Patient with {gen negative/positive:315881} depression screening today. Reviewed local resources for support.  - Patient tobacco use? {tobacco use:25506}  - hx of drug use? {yes/no:25505}    2. Infant care and feeding:  -Patient currently breastmilk feeding? {yes/no:25502}  -Social determinants of health (SDOH) reviewed in EPIC. No concerns***The following needs were identified***  3. Sexuality, contraception and birth spacing - Patient {DOES_DOES NF:2365131 want a pregnancy in the next year.  Desired family size is {NUMBER 1-10:22536} children.  - Reviewed reproductive life planning. Reviewed contraceptive methods based on pt preferences and effectiveness.  Patient desired {Upstream End Methods:24109} today.   - Discussed birth spacing of 18 months  4. Sleep and fatigue -Encouraged family/partner/community support of 4 hrs of uninterrupted sleep to help with mood and fatigue  5. Physical Recovery  - Discussed patients delivery and complications. She describes her labor as {description:25511} - Patient had a {CHL AMB DELIVERY:7405699910}. Patient had a {laceration:25518} laceration. Perineal healing reviewed. Patient expressed understanding - Patient has urinary incontinence? {yes/no:25515} -  Patient {ACTION; IS/IS GI:087931 safe to resume physical and sexual activity  6.  Health Maintenance - HM due items addressed {Yes or If no, why not?:20788} - Last pap smear  Diagnosis  Date Value Ref Range Status  11/22/2021   Final   - Negative for intraepithelial lesion or malignancy (NILM)   Pap smear {done:10129} at today's visit.  -Breast Cancer screening indicated?  {indicated:25516}  7. Chronic Disease/Pregnancy Condition follow up: {Follow up:25499}  - PCP follow up  Aletha Halim, MD [facility]

## 2022-10-04 ENCOUNTER — Encounter: Payer: Self-pay | Admitting: Obstetrics and Gynecology

## 2022-10-04 MED ORDER — NORETHIN ACE-ETH ESTRAD-FE 1-20 MG-MCG PO TABS: 1.0000 | ORAL_TABLET | Freq: Every day | ORAL | 4 refills | Status: DC

## 2022-10-11 ENCOUNTER — Encounter: Payer: Self-pay | Admitting: Internal Medicine

## 2022-10-11 ENCOUNTER — Ambulatory Visit (INDEPENDENT_AMBULATORY_CARE_PROVIDER_SITE_OTHER): Payer: 59 | Admitting: Internal Medicine

## 2022-10-11 VITALS — BP 108/70 | HR 74 | Temp 97.9°F | Resp 16 | Ht 67.0 in | Wt 206.0 lb

## 2022-10-11 DIAGNOSIS — O139 Gestational [pregnancy-induced] hypertension without significant proteinuria, unspecified trimester: Secondary | ICD-10-CM

## 2022-10-11 DIAGNOSIS — Z8759 Personal history of other complications of pregnancy, childbirth and the puerperium: Secondary | ICD-10-CM | POA: Diagnosis not present

## 2022-10-11 DIAGNOSIS — Z0000 Encounter for general adult medical examination without abnormal findings: Secondary | ICD-10-CM

## 2022-10-11 DIAGNOSIS — R002 Palpitations: Secondary | ICD-10-CM

## 2022-10-11 NOTE — Progress Notes (Signed)
Subjective:    Patient ID: Cheyenne Beck, female    DOB: 1995/11/26, 27 y.o.   MRN: [id number]  Patient here for  Chief Complaint  Patient presents with   Annual Exam    HPI Here for physical exam.  Gave birth 08/27/22 [facility])- induced - gestational hypertension.  Blood pressures normal following delivery.  Just had postpartum check 10/03/22.  Blood pressure ok. She is doing well.  Feels good.  No chest pain or sob reported.  No abdominal pain or bowel change reported.  Period - started 10/07/22.  Plans to restart OCPs.  Some increased stress.  Discussed.  Overall handling things well.    Past Medical History:  Diagnosis Date   Breast nodule 01/08/2015   Gestational hypertension 08/27/2022   Headache    Rubella non-immune status, antepartum 06/22/2022   MMR pp   Past Surgical History:  Procedure Laterality Date   NO PAST SURGERIES     Family History  Problem Relation Age of Onset   Hypertension Mother    Diabetes Mother    Hyperlipidemia Father    Hypertension Father    Breast cancer Other    Colon cancer Maternal Grandmother    Arthritis Maternal Grandfather    Kidney disease Maternal Grandfather    Arthritis Paternal Grandmother    Heart disease Paternal Grandmother    Diabetes Paternal Grandmother    Social History   Socioeconomic History   Marital status: Married    Spouse name: Not on file   Number of children: Not on file   Years of education: Not on file   Highest education level: Not on file  Occupational History   Not on file  Tobacco Use   Smoking status: Never   Smokeless tobacco: Never  Vaping Use   Vaping Use: Never used  Substance and Sexual Activity   Alcohol use: No    Alcohol/week: 0.0 standard drinks of alcohol   Drug use: No   Sexual activity: Yes    Partners: Male    Birth control/protection: None    Comment: Husband  Other Topics Concern   Not on file  Social History Narrative   Not on file   Social Determinants of Health    Financial Resource Strain: Not on file  Food Insecurity: No Food Insecurity (08/25/2022)   Hunger Vital Sign    Worried About Running Out of Food in the Last Year: Never true    Ran Out of Food in the Last Year: Never true  Transportation Needs: No Transportation Needs (08/25/2022)   PRAPARE - Hydrologist (Medical): No    Lack of Transportation (Non-Medical): No  Physical Activity: Not on file  Stress: Not on file  Social Connections: Not on file     Review of Systems  Constitutional:  Negative for appetite change and unexpected weight change.  HENT:  Negative for congestion and sinus pressure.   Respiratory:  Negative for cough, chest tightness and shortness of breath.   Cardiovascular:  Negative for chest pain, palpitations and leg swelling.  Gastrointestinal:  Negative for abdominal pain, diarrhea, nausea and vomiting.  Genitourinary:  Negative for difficulty urinating and dysuria.  Musculoskeletal:  Negative for joint swelling and myalgias.  Skin:  Negative for color change and rash.  Neurological:  Negative for dizziness and headaches.  Psychiatric/Behavioral:  Negative for agitation and dysphoric mood.        Objective:     BP 108/70   Pulse  74   Temp 97.9 F (36.6 C)   Resp 16   Ht 5\' 7"  (1.702 m)   Wt 206 lb (93.4 kg)   SpO2 98%   BMI 32.26 kg/m  Wt Readings from Last 3 Encounters:  10/11/22 206 lb (93.4 kg)  10/03/22 207 lb (93.9 kg)  08/25/22 227 lb 1.6 oz (103 kg)    Physical Exam Vitals reviewed.  Constitutional:      General: She is not in acute distress.    Appearance: Normal appearance. She is well-developed.  HENT:     Head: Normocephalic and atraumatic.     Right Ear: External ear normal.     Left Ear: External ear normal.  Eyes:     General: No scleral icterus.       Right eye: No discharge.        Left eye: No discharge.     Conjunctiva/sclera: Conjunctivae normal.  Neck:     Thyroid: No thyromegaly.   Cardiovascular:     Rate and Rhythm: Normal rate and regular rhythm.  Pulmonary:     Effort: No tachypnea, accessory muscle usage or respiratory distress.     Breath sounds: Normal breath sounds. No decreased breath sounds or wheezing.  Chest:  Breasts:    Right: No inverted nipple, mass, nipple discharge or tenderness (no axillary adenopathy).     Left: No inverted nipple, mass, nipple discharge or tenderness (no axilarry adenopathy).  Abdominal:     General: Bowel sounds are normal.     Palpations: Abdomen is soft.     Tenderness: There is no abdominal tenderness.  Musculoskeletal:        General: No swelling or tenderness.     Cervical back: Neck supple.  Lymphadenopathy:     Cervical: No cervical adenopathy.  Skin:    Findings: No erythema or rash.  Neurological:     Mental Status: She is alert and oriented to person, place, and time.  Psychiatric:        Mood and Affect: Mood normal.        Behavior: Behavior normal.      Outpatient Encounter Medications as of 10/11/2022  Medication Sig   acetaminophen (TYLENOL) 325 MG tablet Take 2 tablets (650 mg total) by mouth every 4 (four) hours as needed (for pain scale < 4).   norethindrone-ethinyl estradiol-FE (JUNEL FE 1/20) 1-20 MG-MCG tablet Take 1 tablet by mouth daily.   No facility-administered encounter medications on file as of 10/11/2022.     Lab Results  Component Value Date   WBC 19.4 (H) 08/27/2022   HGB 12.5 08/27/2022   HCT 36.6 08/27/2022   PLT 165 08/27/2022   GLUCOSE 154 (H) 08/25/2022   CHOL 129 08/23/2021   TRIG 36.0 08/23/2021   HDL 63.30 08/23/2021   LDLCALC 58 08/23/2021   ALT 21 08/25/2022   AST 26 08/25/2022   NA 136 08/25/2022   K 2.9 (L) 08/25/2022   CL 109 08/25/2022   CREATININE 0.60 08/25/2022   BUN 7 08/25/2022   CO2 17 (L) 08/25/2022   TSH 2.240 03/01/2022   HGBA1C 4.9 03/01/2022       Assessment & Plan:  Routine general medical examination at a health care facility  Health  care maintenance Assessment & Plan: Physical today.  PAP 11/22/21 - negative with negative HPV.    Gestational hypertension, antepartum Assessment & Plan: Blood pressure normalized after delivery.  Follow.    Palpitations Assessment & Plan: Doing well.  No  problems currently.  Follow.       Einar Pheasant, MD

## 2022-10-16 ENCOUNTER — Encounter: Payer: Self-pay | Admitting: Internal Medicine

## 2022-10-16 NOTE — Assessment & Plan Note (Signed)
Doing well.  No problems currently.  Follow.

## 2022-10-16 NOTE — Assessment & Plan Note (Signed)
Blood pressure normalized after delivery.  Follow.

## 2022-10-16 NOTE — Assessment & Plan Note (Signed)
Physical today.  PAP 11/22/21 - negative with negative HPV.

## 2022-10-23 ENCOUNTER — Encounter: Payer: Self-pay | Admitting: Obstetrics and Gynecology

## 2022-10-25 ENCOUNTER — Other Ambulatory Visit: Payer: Self-pay | Admitting: *Deleted

## 2022-10-25 MED ORDER — LO LOESTRIN FE 1 MG-10 MCG / 10 MCG PO TABS: 1.0000 | ORAL_TABLET | Freq: Every day | ORAL | 3 refills | Status: DC

## 2022-11-02 ENCOUNTER — Ambulatory Visit (INDEPENDENT_AMBULATORY_CARE_PROVIDER_SITE_OTHER): Payer: 59 | Admitting: Family Medicine

## 2022-11-02 ENCOUNTER — Encounter: Payer: Self-pay | Admitting: Family Medicine

## 2022-11-02 VITALS — BP 127/87 | HR 73 | Wt 206.0 lb

## 2022-11-02 DIAGNOSIS — Z3041 Encounter for surveillance of contraceptive pills: Secondary | ICD-10-CM | POA: Diagnosis not present

## 2022-11-02 NOTE — Progress Notes (Signed)
    Subjective:    Patient ID: Cheyenne Beck is a 27 y.o. female presenting with Follow-up and Contraception  on 11/02/2022  HPI: Patient here for f/u. Has switched to Lo Loestrin. Needs BP check. Her mood has improved. No bleeding.  Review of Systems  Constitutional:  Negative for chills and fever.  Respiratory:  Negative for shortness of breath.   Cardiovascular:  Negative for chest pain.  Gastrointestinal:  Negative for abdominal pain, nausea and vomiting.  Genitourinary:  Negative for dysuria.  Skin:  Negative for rash.      Objective:    BP 127/87   Pulse 73   Wt 206 lb (93.4 kg)   Breastfeeding No   BMI 32.26 kg/m  Physical Exam Exam conducted with a chaperone present.  Constitutional:      General: She is not in acute distress.    Appearance: She is well-developed.  HENT:     Head: Normocephalic and atraumatic.  Eyes:     General: No scleral icterus. Cardiovascular:     Rate and Rhythm: Normal rate.  Pulmonary:     Effort: Pulmonary effort is normal.  Abdominal:     Palpations: Abdomen is soft.  Musculoskeletal:     Cervical back: Neck supple.  Skin:    General: Skin is warm and dry.  Neurological:     Mental Status: She is alert and oriented to person, place, and time.         Assessment & Plan:   Surveillance of contraceptive pill - Has rx, warning signs reviewed  Return in 1 year (on 11/02/2023), or if symptoms worsen or fail to improve, for a CPE.  Donnamae Jude, MD 11/02/2022 11:30 AM

## 2022-11-02 NOTE — Progress Notes (Signed)
Patient here to F/U on BCP and B/P.  CC: None

## 2023-10-12 ENCOUNTER — Ambulatory Visit (INDEPENDENT_AMBULATORY_CARE_PROVIDER_SITE_OTHER): Payer: 59 | Admitting: Internal Medicine

## 2023-10-12 VITALS — BP 122/70 | HR 85 | Temp 97.9°F | Resp 16 | Ht 67.0 in | Wt 208.0 lb

## 2023-10-12 DIAGNOSIS — O139 Gestational [pregnancy-induced] hypertension without significant proteinuria, unspecified trimester: Secondary | ICD-10-CM

## 2023-10-12 DIAGNOSIS — Z0000 Encounter for general adult medical examination without abnormal findings: Secondary | ICD-10-CM | POA: Diagnosis not present

## 2023-10-12 DIAGNOSIS — Z8759 Personal history of other complications of pregnancy, childbirth and the puerperium: Secondary | ICD-10-CM | POA: Diagnosis not present

## 2023-10-12 MED ORDER — SERTRALINE HCL 25 MG PO TABS: 25.0000 mg | ORAL_TABLET | Freq: Every day | ORAL | 1 refills | Status: DC

## 2023-10-12 NOTE — Assessment & Plan Note (Addendum)
 Physical today 10/12/23. PAP 11/22/21 - negative with negative HPV.  Being followed at [facility].

## 2023-10-12 NOTE — Progress Notes (Signed)
 Subjective:    Patient ID: Cheyenne Beck, female    DOB: January 27, 1996, 28 y.o.   MRN: [id number]  Patient here for  Chief Complaint  Patient presents with   Annual Exam    HPI Here for a physical exam. Gave birth 08/27/22 [facility] [facility])- induced - gestational hypertension. Being followed at [facility]. Reports she is doing relatively well. Work is going well. Breathing stable. Blood pressure doing well.    Past Medical History:  Diagnosis Date   Breast nodule 01/08/2015   Gestational hypertension 08/27/2022   Headache    Rubella non-immune status, antepartum 06/22/2022   MMR pp   Past Surgical History:  Procedure Laterality Date   NO PAST SURGERIES     Family History  Problem Relation Age of Onset   Hypertension Mother    Diabetes Mother    Hyperlipidemia Father    Hypertension Father    Breast cancer Other    Colon cancer Maternal Grandmother    Arthritis Maternal Grandfather    Kidney disease Maternal Grandfather    Arthritis Paternal Grandmother    Heart disease Paternal Grandmother    Diabetes Paternal Grandmother    Social History   Socioeconomic History   Marital status: Married    Spouse name: Not on file   Number of children: Not on file   Years of education: Not on file   Highest education level: Not on file  Occupational History   Not on file  Tobacco Use   Smoking status: Never   Smokeless tobacco: Never  Vaping Use   Vaping status: Never Used  Substance and Sexual Activity   Alcohol use: No    Alcohol/week: 0.0 standard drinks of alcohol   Drug use: No   Sexual activity: Yes    Partners: Male    Birth control/protection: None    Comment: Husband  Other Topics Concern   Not on file  Social History Narrative   Not on file   Social Drivers of Health   Financial Resource Strain: Not on file  Food Insecurity: No Food Insecurity (08/25/2022)   Hunger Vital Sign    Worried About Running Out of Food in the Last Year: Never true     Ran Out of Food in the Last Year: Never true  Transportation Needs: No Transportation Needs (08/25/2022)   PRAPARE - Administrator, Civil Service (Medical): No    Lack of Transportation (Non-Medical): No  Physical Activity: Not on file  Stress: Not on file  Social Connections: Not on file     Review of Systems  Constitutional:  Negative for appetite change and unexpected weight change.  HENT:  Negative for congestion, sinus pressure and sore throat.   Eyes:  Negative for pain and visual disturbance.  Respiratory:  Negative for cough, chest tightness and shortness of breath.   Cardiovascular:  Negative for chest pain, palpitations and leg swelling.  Gastrointestinal:  Negative for abdominal pain, diarrhea, nausea and vomiting.  Genitourinary:  Negative for difficulty urinating and dysuria.  Musculoskeletal:  Negative for joint swelling and myalgias.  Skin:  Negative for color change and rash.  Neurological:  Negative for dizziness and headaches.  Hematological:  Negative for adenopathy. Does not bruise/bleed easily.  Psychiatric/Behavioral:  Negative for agitation and dysphoric mood.        Objective:     BP 122/70   Pulse 85   Temp 97.9 F (36.6 C)   Resp 16   Ht  5\' 7"  (1.702 m)   Wt 208 lb (94.3 kg)   SpO2 99%   BMI 32.58 kg/m  Wt Readings from Last 3 Encounters:  10/12/23 208 lb (94.3 kg)  11/02/22 206 lb (93.4 kg)  10/11/22 206 lb (93.4 kg)    Physical Exam Vitals reviewed.  Constitutional:      General: She is not in acute distress.    Appearance: Normal appearance.  HENT:     Head: Normocephalic and atraumatic.     Right Ear: External ear normal.     Left Ear: External ear normal.     Mouth/Throat:     Pharynx: No oropharyngeal exudate or posterior oropharyngeal erythema.  Eyes:     General: No scleral icterus.       Right eye: No discharge.        Left eye: No discharge.     Conjunctiva/sclera: Conjunctivae normal.  Neck:     Thyroid: No  thyromegaly.  Cardiovascular:     Rate and Rhythm: Normal rate and regular rhythm.  Pulmonary:     Effort: No respiratory distress.     Breath sounds: Normal breath sounds. No wheezing.  Abdominal:     General: Bowel sounds are normal.     Palpations: Abdomen is soft.     Tenderness: There is no abdominal tenderness.  Musculoskeletal:        General: No swelling or tenderness.     Cervical back: Neck supple. No tenderness.  Lymphadenopathy:     Cervical: No cervical adenopathy.  Skin:    Findings: No erythema or rash.  Neurological:     Mental Status: She is alert.  Psychiatric:        Mood and Affect: Mood normal.        Behavior: Behavior normal.         Outpatient Encounter Medications as of 10/12/2023  Medication Sig   sertraline (ZOLOFT) 25 MG tablet Take 1 tablet (25 mg total) by mouth daily.   acetaminophen (TYLENOL) 325 MG tablet Take 2 tablets (650 mg total) by mouth every 4 (four) hours as needed (for pain scale < 4).   [DISCONTINUED] LO LOESTRIN FE 1 MG-10 MCG / 10 MCG tablet Take 1 tablet by mouth daily.   [DISCONTINUED] norethindrone-ethinyl estradiol-FE (JUNEL FE 1/20) 1-20 MG-MCG tablet Take 1 tablet by mouth daily. (Patient not taking: Reported on 11/02/2022)   No facility-administered encounter medications on file as of 10/12/2023.     Lab Results  Component Value Date   WBC 19.4 (H) 08/27/2022   HGB 12.5 08/27/2022   HCT 36.6 08/27/2022   PLT 165 08/27/2022   GLUCOSE 154 (H) 08/25/2022   CHOL 129 08/23/2021   TRIG 36.0 08/23/2021   HDL 63.30 08/23/2021   LDLCALC 58 08/23/2021   ALT 21 08/25/2022   AST 26 08/25/2022   NA 136 08/25/2022   K 2.9 (L) 08/25/2022   CL 109 08/25/2022   CREATININE 0.60 08/25/2022   BUN 7 08/25/2022   CO2 17 (L) 08/25/2022   TSH 2.240 03/01/2022   HGBA1C 4.9 03/01/2022       Assessment & Plan:  Routine general medical examination at a health care facility  Health care maintenance Assessment & Plan: Physical today  10/12/23. PAP 11/22/21 - negative with negative HPV.  Being followed at [facility].    Gestational hypertension, antepartum Assessment & Plan: Blood pressure doing well now. Follow.    Other orders -     Sertraline  HCl; Take 1 tablet (25 mg total) by mouth daily.  Dispense: 30 tablet; Refill: 1     Dale [place], MD

## 2023-10-15 ENCOUNTER — Encounter: Payer: Self-pay | Admitting: Internal Medicine

## 2023-10-15 NOTE — Assessment & Plan Note (Signed)
Blood pressure doing well now.  Follow.

## 2023-10-18 ENCOUNTER — Encounter: Payer: Self-pay | Admitting: Internal Medicine

## 2023-11-03 ENCOUNTER — Other Ambulatory Visit: Payer: Self-pay | Admitting: Internal Medicine

## 2023-11-05 NOTE — Telephone Encounter (Signed)
 Rx ok'd for zoloft #90 with no refills. Has f/u with me 11/09/23

## 2023-11-09 ENCOUNTER — Other Ambulatory Visit (HOSPITAL_COMMUNITY): Payer: Self-pay

## 2023-11-09 ENCOUNTER — Ambulatory Visit: Admitting: Internal Medicine

## 2023-11-09 VITALS — BP 118/68 | HR 83 | Temp 98.2°F | Resp 16 | Ht 67.0 in | Wt 209.0 lb

## 2023-11-09 DIAGNOSIS — Z8759 Personal history of other complications of pregnancy, childbirth and the puerperium: Secondary | ICD-10-CM

## 2023-11-09 DIAGNOSIS — F439 Reaction to severe stress, unspecified: Secondary | ICD-10-CM | POA: Diagnosis not present

## 2023-11-09 MED ORDER — SERTRALINE HCL 50 MG PO TABS
50.0000 mg | ORAL_TABLET | Freq: Every day | ORAL | 3 refills | Status: DC
  Filled 2023-11-09: qty 30, 30d supply, fill #0

## 2023-11-09 MED ORDER — SERTRALINE HCL 50 MG PO TABS: 50.0000 mg | ORAL_TABLET | Freq: Every day | ORAL | 3 refills | Status: DC

## 2023-11-09 NOTE — Progress Notes (Signed)
 Subjective:    Patient ID: Cheyenne Beck, female    DOB: Aug 15, 1995, 28 y.o.   MRN: [id number]  Patient here for  Chief Complaint  Patient presents with   Medical Management of Chronic Issues    Follow up on zoloft.    HPI Here for a scheduled follow up - started on zoloft. Doing well on the medication. Does feel has leveled things out. Increased stress with her husband's work. Overall she feels she is doing ok. Does not feel needs to adjust the dose. Breathing stable.    Past Medical History:  Diagnosis Date   Breast nodule 01/08/2015   Gestational hypertension 08/27/2022   Headache    Rubella non-immune status, antepartum 06/22/2022   MMR pp   Past Surgical History:  Procedure Laterality Date   NO PAST SURGERIES     Family History  Problem Relation Age of Onset   Hypertension Mother    Diabetes Mother    Hyperlipidemia Father    Hypertension Father    Breast cancer Other    Colon cancer Maternal Grandmother    Arthritis Maternal Grandfather    Kidney disease Maternal Grandfather    Arthritis Paternal Grandmother    Heart disease Paternal Grandmother    Diabetes Paternal Grandmother    Social History   Socioeconomic History   Marital status: Married    Spouse name: Not on file   Number of children: Not on file   Years of education: Not on file   Highest education level: Doctorate  Occupational History   Not on file  Tobacco Use   Smoking status: Never   Smokeless tobacco: Never  Vaping Use   Vaping status: Never Used  Substance and Sexual Activity   Alcohol use: No    Alcohol/week: 0.0 standard drinks of alcohol   Drug use: No   Sexual activity: Yes    Partners: Male    Birth control/protection: None    Comment: Husband  Other Topics Concern   Not on file  Social History Narrative   Not on file   Social Drivers of Health   Financial Resource Strain: Low Risk  (11/08/2023)   Overall Financial Resource Strain (CARDIA)    Difficulty of Paying  Living Expenses: Not hard at all  Food Insecurity: No Food Insecurity (11/08/2023)   Hunger Vital Sign    Worried About Running Out of Food in the Last Year: Never true    Ran Out of Food in the Last Year: Never true  Transportation Needs: No Transportation Needs (11/08/2023)   PRAPARE - Administrator, Civil Service (Medical): No    Lack of Transportation (Non-Medical): No  Physical Activity: Insufficiently Active (11/08/2023)   Exercise Vital Sign    Days of Exercise per Week: 2 days    Minutes of Exercise per Session: 20 min  Stress: No Stress Concern Present (11/08/2023)   [employer] of Occupational Health - Occupational Stress Questionnaire    Feeling of Stress : Only a little  Social Connections: Moderately Integrated (11/08/2023)   Social Connection and Isolation Panel [NHANES]    Frequency of Communication with Friends and Family: More than three times a week    Frequency of Social Gatherings with Friends and Family: Twice a week    Attends Religious Services: More than 4 times per year    Active Member of [employer] or Organizations: No    Attends Banker Meetings: Not on file    Marital Status:  Married     Review of Systems  Constitutional:  Negative for appetite change and unexpected weight change.  HENT:  Negative for congestion and sinus pressure.   Respiratory:  Negative for cough, chest tightness and shortness of breath.   Cardiovascular:  Negative for chest pain, palpitations and leg swelling.  Gastrointestinal:  Negative for abdominal pain, diarrhea, nausea and vomiting.  Genitourinary:  Negative for difficulty urinating and dysuria.  Musculoskeletal:  Negative for joint swelling and myalgias.  Skin:  Negative for color change and rash.  Neurological:  Negative for dizziness and headaches.  Psychiatric/Behavioral:  Negative for agitation and dysphoric mood.        Objective:     BP 118/68   Pulse 83   Temp 98.2 F (36.8 C)   Resp 16    Ht 5\' 7"  (1.702 m)   Wt 209 lb (94.8 kg)   SpO2 99%   BMI 32.73 kg/m  Wt Readings from Last 3 Encounters:  11/09/23 209 lb (94.8 kg)  10/12/23 208 lb (94.3 kg)  11/02/22 206 lb (93.4 kg)    Physical Exam Vitals reviewed.  Constitutional:      General: She is not in acute distress.    Appearance: Normal appearance.  HENT:     Head: Normocephalic and atraumatic.     Right Ear: External ear normal.     Left Ear: External ear normal.     Mouth/Throat:     Pharynx: No oropharyngeal exudate or posterior oropharyngeal erythema.  Eyes:     General: No scleral icterus.       Right eye: No discharge.        Left eye: No discharge.     Conjunctiva/sclera: Conjunctivae normal.  Neck:     Thyroid: No thyromegaly.  Cardiovascular:     Rate and Rhythm: Normal rate and regular rhythm.  Pulmonary:     Effort: No respiratory distress.     Breath sounds: Normal breath sounds. No wheezing.  Abdominal:     General: Bowel sounds are normal.     Palpations: Abdomen is soft.     Tenderness: There is no abdominal tenderness.  Musculoskeletal:        General: No swelling or tenderness.     Cervical back: Neck supple. No tenderness.  Lymphadenopathy:     Cervical: No cervical adenopathy.  Skin:    Findings: No erythema or rash.  Neurological:     Mental Status: She is alert.  Psychiatric:        Mood and Affect: Mood normal.        Behavior: Behavior normal.         Outpatient Encounter Medications as of 11/09/2023  Medication Sig   acetaminophen (TYLENOL) 325 MG tablet Take 2 tablets (650 mg total) by mouth every 4 (four) hours as needed (for pain scale < 4).   [DISCONTINUED] sertraline (ZOLOFT) 25 MG tablet TAKE 1 TABLET (25 MG TOTAL) BY MOUTH DAILY.   [DISCONTINUED] sertraline (ZOLOFT) 50 MG tablet Take 1 tablet (50 mg total) by mouth daily.   sertraline (ZOLOFT) 50 MG tablet Take 1 tablet (50 mg total) by mouth daily.   No facility-administered encounter medications on file as  of 11/09/2023.     Lab Results  Component Value Date   WBC 19.4 (H) 08/27/2022   HGB 12.5 08/27/2022   HCT 36.6 08/27/2022   PLT 165 08/27/2022   GLUCOSE 154 (H) 08/25/2022   CHOL 129 08/23/2021   TRIG 36.0 08/23/2021  HDL 63.30 08/23/2021   LDLCALC 58 08/23/2021   ALT 21 08/25/2022   AST 26 08/25/2022   NA 136 08/25/2022   K 2.9 (L) 08/25/2022   CL 109 08/25/2022   CREATININE 0.60 08/25/2022   BUN 7 08/25/2022   CO2 17 (L) 08/25/2022   TSH 2.240 03/01/2022   HGBA1C 4.9 03/01/2022       Assessment & Plan:  Stress Assessment & Plan: Increased stress. Discussed. Stress with her husband's work situation. On zoloft. Overall she appears to be handling things relatively well. Continue zoloft. Follow. No change in medication.    History of gestational hypertension Assessment & Plan: Blood pressure doing well. Follow.    Other orders -     Sertraline HCl; Take 1 tablet (50 mg total) by mouth daily.  Dispense: 30 tablet; Refill: 3     Dale Wendell, MD

## 2023-11-12 ENCOUNTER — Encounter: Payer: Self-pay | Admitting: Internal Medicine

## 2023-11-12 DIAGNOSIS — F439 Reaction to severe stress, unspecified: Secondary | ICD-10-CM | POA: Insufficient documentation

## 2023-11-12 NOTE — Assessment & Plan Note (Signed)
Blood pressure doing well.  Follow.  

## 2023-11-12 NOTE — Assessment & Plan Note (Signed)
 Increased stress. Discussed. Stress with her husband's work situation. On zoloft. Overall she appears to be handling things relatively well. Continue zoloft. Follow. No change in medication.

## 2023-12-01 ENCOUNTER — Other Ambulatory Visit: Payer: Self-pay | Admitting: Internal Medicine

## 2023-12-11 ENCOUNTER — Encounter: Payer: Self-pay | Admitting: Internal Medicine

## 2023-12-11 ENCOUNTER — Other Ambulatory Visit: Payer: Self-pay

## 2023-12-11 MED ORDER — SERTRALINE HCL 50 MG PO TABS
50.0000 mg | ORAL_TABLET | Freq: Every day | ORAL | 1 refills | Status: DC
  Filled 2023-12-11: qty 90, 90d supply, fill #0

## 2024-01-05 ENCOUNTER — Ambulatory Visit
Admission: EM | Admit: 2024-01-05 | Discharge: 2024-01-05 | Disposition: A | Discharge status: Home / Self Care | Attending: Emergency Medicine | Admitting: Emergency Medicine

## 2024-01-05 DIAGNOSIS — H1033 Unspecified acute conjunctivitis, bilateral: Secondary | ICD-10-CM

## 2024-01-05 MED ORDER — POLYMYXIN B-TRIMETHOPRIM 10000-0.1 UNIT/ML-% OP SOLN: 1.0000 [drp] | Freq: Four times a day (QID) | OPHTHALMIC | 0 refills | Status: AC

## 2024-01-05 NOTE — ED Provider Notes (Signed)
 [facility]    CSN: [id number] Arrival date & time: 01/05/24  1031      History   Chief Complaint Chief Complaint  Patient presents with   Conjunctivitis    HPI Cheyenne Beck is a 28 y.o. female.  Patient presents with bilateral eye redness, drainage, itching since last night.  No eye trauma, eye pain, change in vision.  She had nasal congestion last week but this has resolved.  No fever, cough, shortness of breath.  No OTC medications today.  The history is provided by the patient and medical records.    Past Medical History:  Diagnosis Date   Breast nodule 01/08/2015   Gestational hypertension 08/27/2022   Headache    Rubella non-immune status, antepartum 06/22/2022   MMR pp    Patient Active Problem List   Diagnosis Date Noted   Stress 11/12/2023   History of gestational hypertension 08/27/2022   BMI 31.0-31.9,adult 03/29/2022   Headache 08/29/2021   Palpitations 11/27/2019   Health care maintenance 01/12/2015    Past Surgical History:  Procedure Laterality Date   NO PAST SURGERIES     TUMOR REMOVAL Left 2016    OB History     Gravida  1   Para  1   Term  1   Preterm      AB      Living  1      SAB      IAB      Ectopic      Multiple  0   Live Births  1            Home Medications    Prior to Admission medications   Medication Sig Start Date End Date Taking? Authorizing Provider  trimethoprim-polymyxin b (POLYTRIM) ophthalmic solution Place 1 drop into both eyes 4 (four) times daily for 7 days. 01/05/24 01/12/24 Yes Wellington Half, NP  acetaminophen  (TYLENOL ) 325 MG tablet Take 2 tablets (650 mg total) by mouth every 4 (four) hours as needed (for pain scale < 4). 08/29/22   Lacey Pian, MD  sertraline  (ZOLOFT ) 50 MG tablet Take 1 tablet (50 mg total) by mouth daily. 12/11/23   Dellar Fenton, MD    Family History Family History  Problem Relation Age of Onset   Hypertension Mother    Diabetes Mother     Hyperlipidemia Father    Hypertension Father    Breast cancer Other    Colon cancer Maternal Grandmother    Arthritis Maternal Grandfather    Kidney disease Maternal Grandfather    Arthritis Paternal Grandmother    Heart disease Paternal Grandmother    Diabetes Paternal Grandmother     Social History Social History   Tobacco Use   Smoking status: Never   Smokeless tobacco: Never  Vaping Use   Vaping status: Never Used  Substance Use Topics   Alcohol use: No    Alcohol/week: 0.0 standard drinks of alcohol   Drug use: No     Allergies   Patient has no known allergies.   Review of Systems Review of Systems  Constitutional:  Negative for chills and fever.  HENT:  Negative for ear pain and sore throat.   Eyes:  Positive for discharge, redness and itching. Negative for pain and visual disturbance.  Respiratory:  Negative for cough and shortness of breath.      Physical Exam Triage Vital Signs ED Triage Vitals  Encounter Vitals Group     BP 01/05/24 1054  138/88     Systolic BP Percentile --      Diastolic BP Percentile --      Pulse Rate 01/05/24 1054 71     Resp 01/05/24 1054 20     Temp 01/05/24 1054 98.3 F (36.8 C)     Temp src --      SpO2 01/05/24 1054 97 %     Weight --      Height --      Head Circumference --      Peak Flow --      Pain Score 01/05/24 1047 1     Pain Loc --      Pain Education --      Exclude from Growth Chart --    No data found.  Updated Vital Signs BP 138/88 (BP Location: Left Arm)   Pulse 71   Temp 98.3 F (36.8 C)   Resp 20   LMP 01/05/2024   SpO2 97%   Visual Acuity Right Eye Distance: 20/15 Left Eye Distance: 20/20 Bilateral Distance: 20/15  Right Eye Near:   Left Eye Near:    Bilateral Near:     Physical Exam Constitutional:      General: She is not in acute distress. HENT:     Right Ear: Tympanic membrane normal.     Left Ear: Tympanic membrane normal.     Nose: Nose normal.     Mouth/Throat:      Mouth: Mucous membranes are moist.     Pharynx: Oropharynx is clear.  Eyes:     General: Lids are normal. Vision grossly intact.        Right eye: No discharge.        Left eye: No discharge.     Extraocular Movements: Extraocular movements intact.     Conjunctiva/sclera:     Right eye: Right conjunctiva is injected.     Left eye: Left conjunctiva is injected.     Pupils: Pupils are equal, round, and reactive to light.  Cardiovascular:     Rate and Rhythm: Normal rate and regular rhythm.     Heart sounds: Normal heart sounds.  Pulmonary:     Effort: Pulmonary effort is normal. No respiratory distress.     Breath sounds: Normal breath sounds.  Neurological:     Mental Status: She is alert.      UC Treatments / Results  Labs (all labs ordered are listed, but only abnormal results are displayed) Labs Reviewed - No data to display  EKG   Radiology No results found.  Procedures Procedures (including critical care time)  Medications Ordered in UC Medications - No data to display  Initial Impression / Assessment and Plan / UC Course  I have reviewed the triage vital signs and the nursing notes.  Pertinent labs & imaging results that were available during my care of the patient were reviewed by me and considered in my medical decision making (see chart for details).    Bilateral bacterial conjunctivitis.  Treating with Polytrim eyedrops.  Education provided on conjunctivitis.  Instructed patient to follow-up with her PCP if her symptoms are not improving.  ED precautions discussed.  Patient agrees to plan of care.   Final Clinical Impressions(s) / UC Diagnoses   Final diagnoses:  Acute bacterial conjunctivitis of both eyes     Discharge Instructions      Use the antibiotic eyedrops as prescribed.    Follow-up with your primary care provider if your symptoms are  not improving.    Go to the emergency department if you have acute eye pain, changes in your vision, or  other concerning symptoms.      ED Prescriptions     Medication Sig Dispense Auth. Provider   trimethoprim-polymyxin b (POLYTRIM) ophthalmic solution Place 1 drop into both eyes 4 (four) times daily for 7 days. 10 mL Wellington Half, NP      PDMP not reviewed this encounter.   Wellington Half, NP 01/05/24 1134

## 2024-01-05 NOTE — Discharge Instructions (Addendum)
Use the antibiotic eyedrops as prescribed.    Follow-up with your primary care provider if your symptoms are not improving.    Go to the emergency department if you have acute eye pain, changes in your vision, or other concerning symptoms.    

## 2024-01-05 NOTE — ED Triage Notes (Signed)
 Pt presents for bilateral eye redness, drainage, and itching. States she has had sinus congestion over the last week and that she sometimes has eye redness.    No vision changes.

## 2024-02-06 ENCOUNTER — Encounter: Payer: Self-pay | Admitting: Internal Medicine

## 2024-02-06 ENCOUNTER — Ambulatory Visit: Admitting: Internal Medicine

## 2024-02-06 VITALS — BP 120/76 | HR 97 | Temp 97.9°F | Ht 68.0 in | Wt 215.2 lb

## 2024-02-06 DIAGNOSIS — Z8759 Personal history of other complications of pregnancy, childbirth and the puerperium: Secondary | ICD-10-CM

## 2024-02-06 DIAGNOSIS — F439 Reaction to severe stress, unspecified: Secondary | ICD-10-CM | POA: Diagnosis not present

## 2024-02-06 DIAGNOSIS — Z13220 Encounter for screening for lipoid disorders: Secondary | ICD-10-CM

## 2024-02-06 DIAGNOSIS — R233 Spontaneous ecchymoses: Secondary | ICD-10-CM | POA: Insufficient documentation

## 2024-02-06 NOTE — Progress Notes (Signed)
 Subjective:    Patient ID: Cheyenne Beck, female    DOB: 1996-03-05, 28 y.o.   MRN: [id number]  Patient here for  Chief Complaint  Patient presents with   Medical Management of Chronic Issues    HPI Here for a scheduled follow up - follow up regarding increased stress. On zoloft . Overall appears to be doing well. Stress is some better. Does not feel she needs any further intervention. Breathing stable. No abdominal pain or bowel change reported.    Past Medical History:  Diagnosis Date   Breast nodule 01/08/2015   Gestational hypertension 08/27/2022   Headache    Rubella non-immune status, antepartum 06/22/2022   MMR pp   Past Surgical History:  Procedure Laterality Date   NO PAST SURGERIES     TUMOR REMOVAL Left 2016   Family History  Problem Relation Age of Onset   Hypertension Mother    Diabetes Mother    Hyperlipidemia Father    Hypertension Father    Breast cancer Other    Colon cancer Maternal Grandmother    Arthritis Maternal Grandfather    Kidney disease Maternal Grandfather    Arthritis Paternal Grandmother    Heart disease Paternal Grandmother    Diabetes Paternal Grandmother    Social History   Socioeconomic History   Marital status: Married    Spouse name: Not on file   Number of children: Not on file   Years of education: Not on file   Highest education level: Doctorate  Occupational History   Not on file  Tobacco Use   Smoking status: Never   Smokeless tobacco: Never  Vaping Use   Vaping status: Never Used  Substance and Sexual Activity   Alcohol use: No    Alcohol/week: 0.0 standard drinks of alcohol   Drug use: No   Sexual activity: Yes    Partners: Male    Birth control/protection: None    Comment: Husband  Other Topics Concern   Not on file  Social History Narrative   Not on file   Social Drivers of Health   Financial Resource Strain: Low Risk  (02/05/2024)   Overall Financial Resource Strain (CARDIA)    Difficulty of Paying  Living Expenses: Not hard at all  Food Insecurity: No Food Insecurity (02/05/2024)   Hunger Vital Sign    Worried About Running Out of Food in the Last Year: Never true    Ran Out of Food in the Last Year: Never true  Transportation Needs: No Transportation Needs (02/05/2024)   PRAPARE - Administrator, Civil Service (Medical): No    Lack of Transportation (Non-Medical): No  Physical Activity: Insufficiently Active (02/05/2024)   Exercise Vital Sign    Days of Exercise per Week: 2 days    Minutes of Exercise per Session: 10 min  Stress: No Stress Concern Present (02/05/2024)   [employer] of Occupational Health - Occupational Stress Questionnaire    Feeling of Stress: Only a little  Social Connections: Socially Integrated (02/05/2024)   Social Connection and Isolation Panel    Frequency of Communication with Friends and Family: More than three times a week    Frequency of Social Gatherings with Friends and Family: Three times a week    Attends Religious Services: More than 4 times per year    Active Member of Clubs or Organizations: Yes    Attends Banker Meetings: More than 4 times per year    Marital Status: Married  Review of Systems  Constitutional:  Negative for appetite change and unexpected weight change.  HENT:  Negative for congestion and sinus pressure.   Respiratory:  Negative for cough, chest tightness and shortness of breath.   Cardiovascular:  Negative for chest pain, palpitations and leg swelling.  Gastrointestinal:  Negative for abdominal pain, diarrhea, nausea and vomiting.  Genitourinary:  Negative for difficulty urinating and dysuria.  Musculoskeletal:  Negative for joint swelling and myalgias.  Skin:  Negative for color change and rash.  Neurological:  Negative for dizziness and headaches.  Psychiatric/Behavioral:  Negative for agitation and dysphoric mood.        Objective:     BP 120/76   Pulse 97   Temp 97.9 F (36.6 C)  (Oral)   Ht 5' 8 (1.727 m)   Wt 215 lb 3.2 oz (97.6 kg)   LMP  (LMP Unknown)   SpO2 99%   BMI 32.72 kg/m  Wt Readings from Last 3 Encounters:  02/06/24 215 lb 3.2 oz (97.6 kg)  11/09/23 209 lb (94.8 kg)  10/12/23 208 lb (94.3 kg)    Physical Exam Vitals reviewed.  Constitutional:      General: She is not in acute distress.    Appearance: Normal appearance.  HENT:     Head: Normocephalic and atraumatic.     Right Ear: External ear normal.     Left Ear: External ear normal.     Mouth/Throat:     Pharynx: No oropharyngeal exudate or posterior oropharyngeal erythema.  Eyes:     General: No scleral icterus.       Right eye: No discharge.        Left eye: No discharge.     Conjunctiva/sclera: Conjunctivae normal.  Neck:     Thyroid : No thyromegaly.  Cardiovascular:     Rate and Rhythm: Normal rate and regular rhythm.  Pulmonary:     Effort: No respiratory distress.     Breath sounds: Normal breath sounds. No wheezing.  Abdominal:     General: Bowel sounds are normal.     Palpations: Abdomen is soft.     Tenderness: There is no abdominal tenderness.  Musculoskeletal:        General: No swelling or tenderness.     Cervical back: Neck supple. No tenderness.  Lymphadenopathy:     Cervical: No cervical adenopathy.  Skin:    Findings: No erythema or rash.  Neurological:     Mental Status: She is alert.  Psychiatric:        Mood and Affect: Mood normal.        Behavior: Behavior normal.         Outpatient Encounter Medications as of 02/06/2024  Medication Sig   acetaminophen  (TYLENOL ) 325 MG tablet Take 2 tablets (650 mg total) by mouth every 4 (four) hours as needed (for pain scale < 4).   sertraline  (ZOLOFT ) 50 MG tablet Take 1 tablet (50 mg total) by mouth daily.   No facility-administered encounter medications on file as of 02/06/2024.     Lab Results  Component Value Date   WBC 19.4 (H) 08/27/2022   HGB 12.5 08/27/2022   HCT 36.6 08/27/2022   PLT 165  08/27/2022   GLUCOSE 154 (H) 08/25/2022   CHOL 129 08/23/2021   TRIG 36.0 08/23/2021   HDL 63.30 08/23/2021   LDLCALC 58 08/23/2021   ALT 21 08/25/2022   AST 26 08/25/2022   NA 136 08/25/2022   K 2.9 (L) 08/25/2022  CL 109 08/25/2022   CREATININE 0.60 08/25/2022   BUN 7 08/25/2022   CO2 17 (L) 08/25/2022   TSH 2.240 03/01/2022   HGBA1C 4.9 03/01/2022       Assessment & Plan:  Stress Assessment & Plan: Overall she appears to be handling things relatively well. Continue zoloft . Follow. No change in medication.   Orders: -     CBC with Differential/Platelet; Future -     TSH; Future  History of gestational hypertension Assessment & Plan: Blood pressure doing well. Follow.   Orders: -     Comprehensive metabolic panel with GFR; Future  Easy bruising -     CBC with Differential/Platelet; Future -     IBC + Ferritin; Future  Screening cholesterol level -     Lipid panel; Future     Allena Hamilton, MD

## 2024-02-11 ENCOUNTER — Encounter: Payer: Self-pay | Admitting: Internal Medicine

## 2024-02-11 NOTE — Assessment & Plan Note (Signed)
 Overall she appears to be handling things relatively well. Continue zoloft . Follow. No change in medication.

## 2024-02-11 NOTE — Assessment & Plan Note (Signed)
Blood pressure doing well.  Follow.  

## 2024-02-22 ENCOUNTER — Other Ambulatory Visit

## 2024-03-05 ENCOUNTER — Encounter: Payer: Self-pay | Admitting: Internal Medicine

## 2024-03-08 ENCOUNTER — Other Ambulatory Visit: Payer: Self-pay

## 2024-03-08 ENCOUNTER — Encounter: Payer: Self-pay | Admitting: Internal Medicine

## 2024-03-08 ENCOUNTER — Other Ambulatory Visit

## 2024-03-08 ENCOUNTER — Ambulatory Visit: Admitting: Internal Medicine

## 2024-03-08 DIAGNOSIS — R233 Spontaneous ecchymoses: Secondary | ICD-10-CM | POA: Diagnosis not present

## 2024-03-08 DIAGNOSIS — Z8759 Personal history of other complications of pregnancy, childbirth and the puerperium: Secondary | ICD-10-CM | POA: Diagnosis not present

## 2024-03-08 DIAGNOSIS — F439 Reaction to severe stress, unspecified: Secondary | ICD-10-CM

## 2024-03-08 DIAGNOSIS — Z13220 Encounter for screening for lipoid disorders: Secondary | ICD-10-CM

## 2024-03-08 LAB — CBC WITH DIFFERENTIAL/PLATELET
Basophils Absolute: 0 K/uL (ref 0.0–0.1)
Basophils Relative: 1.2 % (ref 0.0–3.0)
Eosinophils Absolute: 0.1 K/uL (ref 0.0–0.7)
Eosinophils Relative: 1.9 % (ref 0.0–5.0)
HCT: 38.9 % (ref 36.0–46.0)
Hemoglobin: 12.9 g/dL (ref 12.0–15.0)
Lymphocytes Relative: 31.9 % (ref 12.0–46.0)
Lymphs Abs: 1.3 K/uL (ref 0.7–4.0)
MCHC: 33.1 g/dL (ref 30.0–36.0)
MCV: 84 fl (ref 78.0–100.0)
Monocytes Absolute: 0.3 K/uL (ref 0.1–1.0)
Monocytes Relative: 7.5 % (ref 3.0–12.0)
Neutro Abs: 2.3 K/uL (ref 1.4–7.7)
Neutrophils Relative %: 57.5 % (ref 43.0–77.0)
Platelets: 205 K/uL (ref 150.0–400.0)
RBC: 4.64 Mil/uL (ref 3.87–5.11)
RDW: 14.5 % (ref 11.5–15.5)
WBC: 3.9 K/uL — ABNORMAL LOW (ref 4.0–10.5)

## 2024-03-08 LAB — LIPID PANEL
Cholesterol: 121 mg/dL (ref 0–200)
HDL: 62.6 mg/dL (ref >39.00)
LDL Cholesterol: 50 mg/dL (ref 0–99)
NonHDL: 58
Total CHOL/HDL Ratio: 2
Triglycerides: 39 mg/dL (ref 0.0–149.0)
VLDL: 7.8 mg/dL (ref 0.0–40.0)

## 2024-03-08 LAB — COMPREHENSIVE METABOLIC PANEL WITH GFR
ALT: 12 U/L (ref 0–35)
AST: 16 U/L (ref 0–37)
Albumin: 4.3 g/dL (ref 3.5–5.2)
Alkaline Phosphatase: 43 U/L (ref 39–117)
BUN: 15 mg/dL (ref 6–23)
CO2: 27 meq/L (ref 19–32)
Calcium: 9.1 mg/dL (ref 8.4–10.5)
Chloride: 105 meq/L (ref 96–112)
Creatinine, Ser: 0.61 mg/dL (ref 0.40–1.20)
GFR: 120.68 mL/min (ref >60.00)
Glucose, Bld: 79 mg/dL (ref 70–99)
Potassium: 4.1 meq/L (ref 3.5–5.1)
Sodium: 139 meq/L (ref 135–145)
Total Bilirubin: 0.6 mg/dL (ref 0.2–1.2)
Total Protein: 6.8 g/dL (ref 6.0–8.3)

## 2024-03-08 LAB — IBC + FERRITIN
Ferritin: 13.9 ng/mL (ref 10.0–291.0)
Iron: 47 ug/dL (ref 42–145)
Saturation Ratios: 12.2 % — ABNORMAL LOW (ref 20.0–50.0)
TIBC: 386.4 ug/dL (ref 250.0–450.0)
Transferrin: 276 mg/dL (ref 212.0–360.0)

## 2024-03-08 LAB — TSH: TSH: 1.48 u[IU]/mL (ref 0.35–5.50)

## 2024-03-08 MED ORDER — BUPROPION HCL ER (XL) 150 MG PO TB24
150.0000 mg | ORAL_TABLET | Freq: Every day | ORAL | 2 refills | Status: DC
Start: 2024-03-08 — End: 2024-05-13
  Filled 2024-03-08: qty 90, 90d supply, fill #0

## 2024-03-08 NOTE — Assessment & Plan Note (Signed)
 Has been on Zoloft .  Has noticed weight gain.  We discussed treatment options.  Discussed a trial of Wellbutrin .  Will taper Zoloft  as directed.  Start Wellbutrin  XL 150 mg daily.  Follow.  Call with update.

## 2024-03-08 NOTE — Progress Notes (Signed)
 Subjective:    Patient ID: Cheyenne Beck, female    DOB: Jan 29, 1996, 29 y.o.   MRN: [id number]  Patient here for  Chief Complaint  Patient presents with   Medical Management of Chronic Issues    Change zoloft  due to weight gain    HPI Here for work in appt - work in to discuss changing zoloft . Concerned the zoloft  is contributing to weight gain. Also reports decreased energy and some increased fatigue.  Has not changed her diet.  We discussed continuing diet and exercise .  Discussed treatment options.  Discussed Wellbutrin .  Does not have side effect of weight gain.  Discussed monitoring control of her stress.   Past Medical History:  Diagnosis Date   Breast nodule 01/08/2015   Gestational hypertension 08/27/2022   Headache    Rubella non-immune status, antepartum 06/22/2022   MMR pp   Past Surgical History:  Procedure Laterality Date   NO PAST SURGERIES     TUMOR REMOVAL Left 2016   Family History  Problem Relation Age of Onset   Hypertension Mother    Diabetes Mother    Hyperlipidemia Father    Hypertension Father    Breast cancer Other    Colon cancer Maternal Grandmother    Arthritis Maternal Grandfather    Kidney disease Maternal Grandfather    Arthritis Paternal Grandmother    Heart disease Paternal Grandmother    Diabetes Paternal Grandmother    Social History   Socioeconomic History   Marital status: Married    Spouse name: Not on file   Number of children: Not on file   Years of education: Not on file   Highest education level: Doctorate  Occupational History   Not on file  Tobacco Use   Smoking status: Never   Smokeless tobacco: Never  Vaping Use   Vaping status: Never Used  Substance and Sexual Activity   Alcohol use: No    Alcohol/week: 0.0 standard drinks of alcohol   Drug use: No   Sexual activity: Yes    Partners: Male    Birth control/protection: None    Comment: Husband  Other Topics Concern   Not on file  Social History  Narrative   Not on file   Social Drivers of Health   Financial Resource Strain: Low Risk  (02/05/2024)   Overall Financial Resource Strain (CARDIA)    Difficulty of Paying Living Expenses: Not hard at all  Food Insecurity: No Food Insecurity (02/05/2024)   Hunger Vital Sign    Worried About Running Out of Food in the Last Year: Never true    Ran Out of Food in the Last Year: Never true  Transportation Needs: No Transportation Needs (02/05/2024)   PRAPARE - Administrator, Civil Service (Medical): No    Lack of Transportation (Non-Medical): No  Physical Activity: Insufficiently Active (02/05/2024)   Exercise Vital Sign    Days of Exercise per Week: 2 days    Minutes of Exercise per Session: 10 min  Stress: No Stress Concern Present (02/05/2024)   [employer] of Occupational Health - Occupational Stress Questionnaire    Feeling of Stress: Only a little  Social Connections: Socially Integrated (02/05/2024)   Social Connection and Isolation Panel    Frequency of Communication with Friends and Family: More than three times a week    Frequency of Social Gatherings with Friends and Family: Three times a week    Attends Religious Services: More than 4 times per  year    Active Member of Clubs or Organizations: Yes    Attends Banker Meetings: More than 4 times per year    Marital Status: Married     Review of Systems  Constitutional:  Positive for fatigue. Negative for appetite change and unexpected weight change.  HENT:  Negative for congestion and sinus pressure.   Respiratory:  Negative for cough, chest tightness and shortness of breath.   Cardiovascular:  Negative for chest pain, palpitations and leg swelling.  Gastrointestinal:  Negative for abdominal pain, diarrhea, nausea and vomiting.  Genitourinary:  Negative for difficulty urinating and dysuria.  Musculoskeletal:  Negative for joint swelling and myalgias.  Skin:  Negative for color change and rash.   Neurological:  Negative for dizziness and headaches.  Psychiatric/Behavioral:  Negative for agitation and dysphoric mood.        Objective:     BP 110/70   Pulse 81   Resp 16   Ht 5' 8 (1.727 m)   Wt 218 lb 9.6 oz (99.2 kg)   SpO2 99%   BMI 33.24 kg/m  Wt Readings from Last 3 Encounters:  03/08/24 218 lb 9.6 oz (99.2 kg)  02/06/24 215 lb 3.2 oz (97.6 kg)  11/09/23 209 lb (94.8 kg)    Physical Exam Vitals reviewed.  Constitutional:      General: She is not in acute distress.    Appearance: Normal appearance.  HENT:     Head: Normocephalic and atraumatic.     Right Ear: External ear normal.     Left Ear: External ear normal.     Mouth/Throat:     Pharynx: No oropharyngeal exudate or posterior oropharyngeal erythema.  Eyes:     General: No scleral icterus.       Right eye: No discharge.        Left eye: No discharge.     Conjunctiva/sclera: Conjunctivae normal.  Neck:     Thyroid : No thyromegaly.  Cardiovascular:     Rate and Rhythm: Normal rate and regular rhythm.  Pulmonary:     Effort: No respiratory distress.     Breath sounds: Normal breath sounds. No wheezing.  Abdominal:     General: Bowel sounds are normal.     Palpations: Abdomen is soft.     Tenderness: There is no abdominal tenderness.  Musculoskeletal:        General: No swelling or tenderness.     Cervical back: Neck supple. No tenderness.  Lymphadenopathy:     Cervical: No cervical adenopathy.  Skin:    Findings: No erythema or rash.  Neurological:     Mental Status: She is alert.  Psychiatric:        Mood and Affect: Mood normal.        Behavior: Behavior normal.         Outpatient Encounter Medications as of 03/08/2024  Medication Sig   buPROPion  (WELLBUTRIN  XL) 150 MG 24 hr tablet Take 1 tablet (150 mg total) by mouth daily.   acetaminophen  (TYLENOL ) 325 MG tablet Take 2 tablets (650 mg total) by mouth every 4 (four) hours as needed (for pain scale < 4).   sertraline  (ZOLOFT ) 50 MG  tablet Take 1 tablet (50 mg total) by mouth daily.   No facility-administered encounter medications on file as of 03/08/2024.     Lab Results  Component Value Date   WBC 19.4 (H) 08/27/2022   HGB 12.5 08/27/2022   HCT 36.6 08/27/2022   PLT 165  08/27/2022   GLUCOSE 154 (H) 08/25/2022   CHOL 129 08/23/2021   TRIG 36.0 08/23/2021   HDL 63.30 08/23/2021   LDLCALC 58 08/23/2021   ALT 21 08/25/2022   AST 26 08/25/2022   NA 136 08/25/2022   K 2.9 (L) 08/25/2022   CL 109 08/25/2022   CREATININE 0.60 08/25/2022   BUN 7 08/25/2022   CO2 17 (L) 08/25/2022   TSH 2.240 03/01/2022   HGBA1C 4.9 03/01/2022    No results found.     Assessment & Plan:  Easy bruising -     IBC + Ferritin -     CBC with Differential/Platelet  Screening cholesterol level -     Lipid panel  Stress Assessment & Plan: Has been on Zoloft .  Has noticed weight gain.  We discussed treatment options.  Discussed a trial of Wellbutrin .  Will taper Zoloft  as directed.  Start Wellbutrin  XL 150 mg daily.  Follow.  Call with update.  Orders: -     TSH -     CBC with Differential/Platelet  History of gestational hypertension Assessment & Plan: Blood pressure doing well on no medication.  Follow.  Orders: -     Comprehensive metabolic panel with GFR  Other orders -     buPROPion  HCl ER (XL); Take 1 tablet (150 mg total) by mouth daily.  Dispense: 30 tablet; Refill: 2     Allena Hamilton, MD

## 2024-03-08 NOTE — Patient Instructions (Signed)
 Taper zoloft  - 50mg  - take 1/2 tablet per day for one week and then 1/2 tablet every other day for two more doses and then stop.

## 2024-03-08 NOTE — Assessment & Plan Note (Signed)
Blood pressure doing well on no medication.  Follow.   

## 2024-03-14 ENCOUNTER — Ambulatory Visit: Payer: Self-pay | Admitting: Internal Medicine

## 2024-03-14 NOTE — Telephone Encounter (Signed)
 Pt scheduled and notified 9/18 @7 :45 am.

## 2024-04-11 ENCOUNTER — Telehealth: Payer: Self-pay | Admitting: Internal Medicine

## 2024-04-11 DIAGNOSIS — R7989 Other specified abnormal findings of blood chemistry: Secondary | ICD-10-CM

## 2024-04-11 NOTE — Telephone Encounter (Signed)
 Lab order needed

## 2024-04-11 NOTE — Addendum Note (Signed)
 Addended by: LEARTA PORTO D on: 04/11/2024 01:41 PM   Modules accepted: Orders

## 2024-04-11 NOTE — Telephone Encounter (Signed)
 Lab ordered.

## 2024-04-25 ENCOUNTER — Other Ambulatory Visit (INDEPENDENT_AMBULATORY_CARE_PROVIDER_SITE_OTHER)

## 2024-04-25 DIAGNOSIS — R7989 Other specified abnormal findings of blood chemistry: Secondary | ICD-10-CM

## 2024-04-25 LAB — CBC WITH DIFFERENTIAL/PLATELET
Basophils Absolute: 0 K/uL (ref 0.0–0.1)
Basophils Relative: 1 % (ref 0.0–3.0)
Eosinophils Absolute: 0.1 K/uL (ref 0.0–0.7)
Eosinophils Relative: 2.1 % (ref 0.0–5.0)
HCT: 38.7 % (ref 36.0–46.0)
Hemoglobin: 13.2 g/dL (ref 12.0–15.0)
Lymphocytes Relative: 24.6 % (ref 12.0–46.0)
Lymphs Abs: 0.9 K/uL (ref 0.7–4.0)
MCHC: 34.2 g/dL (ref 30.0–36.0)
MCV: 81.4 fl (ref 78.0–100.0)
Monocytes Absolute: 0.3 K/uL (ref 0.1–1.0)
Monocytes Relative: 7.9 % (ref 3.0–12.0)
Neutro Abs: 2.4 K/uL (ref 1.4–7.7)
Neutrophils Relative %: 64.4 % (ref 43.0–77.0)
Platelets: 182 K/uL (ref 150.0–400.0)
RBC: 4.76 Mil/uL (ref 3.87–5.11)
RDW: 13.5 % (ref 11.5–15.5)
WBC: 3.7 K/uL — ABNORMAL LOW (ref 4.0–10.5)

## 2024-04-25 LAB — FERRITIN: Ferritin: 18.9 ng/mL (ref 10.0–291.0)

## 2024-04-26 ENCOUNTER — Ambulatory Visit: Payer: Self-pay | Admitting: Internal Medicine

## 2024-05-14 ENCOUNTER — Ambulatory Visit: Admitting: Internal Medicine

## 2024-05-14 ENCOUNTER — Other Ambulatory Visit: Payer: Self-pay

## 2024-05-14 VITALS — BP 110/70 | HR 78 | Resp 16 | Ht 68.0 in | Wt 223.8 lb

## 2024-05-14 DIAGNOSIS — F439 Reaction to severe stress, unspecified: Secondary | ICD-10-CM | POA: Diagnosis not present

## 2024-05-14 DIAGNOSIS — Z713 Dietary counseling and surveillance: Secondary | ICD-10-CM

## 2024-05-14 DIAGNOSIS — Z8759 Personal history of other complications of pregnancy, childbirth and the puerperium: Secondary | ICD-10-CM | POA: Diagnosis not present

## 2024-05-14 MED ORDER — BUPROPION HCL ER (XL) 150 MG PO TB24
150.0000 mg | ORAL_TABLET | Freq: Every day | ORAL | 2 refills | Status: AC
  Filled 2024-05-14: qty 30, 30d supply, fill #0

## 2024-05-14 MED ORDER — BUPROPION HCL ER (XL) 150 MG PO TB24: 150.0000 mg | ORAL_TABLET | Freq: Every day | ORAL | 2 refills | Status: DC

## 2024-05-14 MED ORDER — WEGOVY 0.25 MG/0.5ML ~~LOC~~ SOAJ
0.2500 mg | SUBCUTANEOUS | 2 refills | Status: AC
  Filled 2024-05-14: qty 2, 28d supply, fill #0

## 2024-05-14 NOTE — Progress Notes (Signed)
 Subjective:    Patient ID: Cheyenne Beck, female    DOB: 10-Jan-1996, 28 y.o.   MRN: [id number]  Patient here for  Chief Complaint  Patient presents with   Medical Management of Chronic Issues    HPI Here for a scheduled follow up - follow up regarding increased stress. Previously on zoloft . Last visit, discussed tapering off zoloft  and starting wellbutrin . Feels the wellbuttrin has given her a little more energy. Increased work stress. Discussed. Overall appears to be handling things relatively well. Had questions about weight loss options. Discussed medication. Discussed referral to healthy weight and wellness program. Discussed diet and exercise.    Past Medical History:  Diagnosis Date   Breast nodule 01/08/2015   Gestational hypertension 08/27/2022   Headache    Rubella non-immune status, antepartum 06/22/2022   MMR pp   Past Surgical History:  Procedure Laterality Date   NO PAST SURGERIES     TUMOR REMOVAL Left 2016   Family History  Problem Relation Age of Onset   Hypertension Mother    Diabetes Mother    Hyperlipidemia Father    Hypertension Father    Breast cancer Other    Colon cancer Maternal Grandmother    Arthritis Maternal Grandfather    Kidney disease Maternal Grandfather    Arthritis Paternal Grandmother    Heart disease Paternal Grandmother    Diabetes Paternal Grandmother    Social History   Socioeconomic History   Marital status: Married    Spouse name: Not on file   Number of children: Not on file   Years of education: Not on file   Highest education level: Doctorate  Occupational History   Not on file  Tobacco Use   Smoking status: Never   Smokeless tobacco: Never  Vaping Use   Vaping status: Never Used  Substance and Sexual Activity   Alcohol use: No    Alcohol/week: 0.0 standard drinks of alcohol   Drug use: No   Sexual activity: Yes    Partners: Male    Birth control/protection: None    Comment: Husband  Other Topics Concern    Not on file  Social History Narrative   Not on file   Social Drivers of Health   Financial Resource Strain: Low Risk  (02/05/2024)   Overall Financial Resource Strain (CARDIA)    Difficulty of Paying Living Expenses: Not hard at all  Food Insecurity: No Food Insecurity (02/05/2024)   Hunger Vital Sign    Worried About Running Out of Food in the Last Year: Never true    Ran Out of Food in the Last Year: Never true  Transportation Needs: No Transportation Needs (02/05/2024)   PRAPARE - Administrator, Civil Service (Medical): No    Lack of Transportation (Non-Medical): No  Physical Activity: Insufficiently Active (02/05/2024)   Exercise Vital Sign    Days of Exercise per Week: 2 days    Minutes of Exercise per Session: 10 min  Stress: No Stress Concern Present (02/05/2024)   [employer] of Occupational Health - Occupational Stress Questionnaire    Feeling of Stress: Only a little  Social Connections: Socially Integrated (02/05/2024)   Social Connection and Isolation Panel    Frequency of Communication with Friends and Family: More than three times a week    Frequency of Social Gatherings with Friends and Family: Three times a week    Attends Religious Services: More than 4 times per year    Active Member of  Clubs or Organizations: Yes    Attends Engineer, structural: More than 4 times per year    Marital Status: Married     Review of Systems  Constitutional:  Negative for appetite change and unexpected weight change.  HENT:  Negative for congestion and sinus pressure.   Respiratory:  Negative for cough, chest tightness and shortness of breath.   Cardiovascular:  Negative for chest pain, palpitations and leg swelling.  Gastrointestinal:  Negative for abdominal pain, diarrhea, nausea and vomiting.  Genitourinary:  Negative for difficulty urinating and dysuria.  Musculoskeletal:  Negative for joint swelling and myalgias.  Skin:  Negative for color change and  rash.  Neurological:  Negative for dizziness and headaches.  Psychiatric/Behavioral:  Negative for agitation and dysphoric mood.        Increased stress as outlined.        Objective:     BP 110/70   Pulse 78   Resp 16   Ht 5' 8 (1.727 m)   Wt 223 lb 12.8 oz (101.5 kg)   SpO2 98%   BMI 34.03 kg/m  Wt Readings from Last 3 Encounters:  05/14/24 223 lb 12.8 oz (101.5 kg)  03/08/24 218 lb 9.6 oz (99.2 kg)  02/06/24 215 lb 3.2 oz (97.6 kg)    Physical Exam Vitals reviewed.  Constitutional:      General: She is not in acute distress.    Appearance: Normal appearance.  HENT:     Head: Normocephalic and atraumatic.     Right Ear: External ear normal.     Left Ear: External ear normal.     Mouth/Throat:     Pharynx: No oropharyngeal exudate or posterior oropharyngeal erythema.  Eyes:     General: No scleral icterus.       Right eye: No discharge.        Left eye: No discharge.     Conjunctiva/sclera: Conjunctivae normal.  Neck:     Thyroid : No thyromegaly.  Cardiovascular:     Rate and Rhythm: Normal rate and regular rhythm.  Pulmonary:     Effort: No respiratory distress.     Breath sounds: Normal breath sounds. No wheezing.  Abdominal:     General: Bowel sounds are normal.     Palpations: Abdomen is soft.     Tenderness: There is no abdominal tenderness.  Musculoskeletal:        General: No swelling or tenderness.     Cervical back: Neck supple. No tenderness.  Lymphadenopathy:     Cervical: No cervical adenopathy.  Skin:    Findings: No erythema or rash.  Neurological:     Mental Status: She is alert.  Psychiatric:        Mood and Affect: Mood normal.        Behavior: Behavior normal.         Outpatient Encounter Medications as of 05/14/2024  Medication Sig   semaglutide-weight management (WEGOVY) 0.25 MG/0.5ML SOAJ SQ injection Inject 0.25 mg into the skin once a week.   acetaminophen  (TYLENOL ) 325 MG tablet Take 2 tablets (650 mg total) by mouth  every 4 (four) hours as needed (for pain scale < 4).   buPROPion  (WELLBUTRIN  XL) 150 MG 24 hr tablet Take 1 tablet (150 mg total) by mouth daily.   [DISCONTINUED] buPROPion  (WELLBUTRIN  XL) 150 MG 24 hr tablet Take 1 tablet (150 mg total) by mouth daily.   [DISCONTINUED] buPROPion  (WELLBUTRIN  XL) 150 MG 24 hr tablet Take 1 tablet (150  mg total) by mouth daily.   [DISCONTINUED] sertraline  (ZOLOFT ) 50 MG tablet Take 1 tablet (50 mg total) by mouth daily.   No facility-administered encounter medications on file as of 05/14/2024.     Lab Results  Component Value Date   WBC 3.7 (L) 04/25/2024   HGB 13.2 04/25/2024   HCT 38.7 04/25/2024   PLT 182.0 04/25/2024   GLUCOSE 79 03/08/2024   CHOL 121 03/08/2024   TRIG 39.0 03/08/2024   HDL 62.60 03/08/2024   LDLCALC 50 03/08/2024   ALT 12 03/08/2024   AST 16 03/08/2024   NA 139 03/08/2024   K 4.1 03/08/2024   CL 105 03/08/2024   CREATININE 0.61 03/08/2024   BUN 15 03/08/2024   CO2 27 03/08/2024   TSH 1.48 03/08/2024   HGBA1C 4.9 03/01/2022       Assessment & Plan:  Stress Assessment & Plan: Was on zoloft . Tapered off. On wellbutrin  now. Tolerating. Increased work stress. Does not feel needs any further intervention at this time. Follow. Continue wellbutrin  at current dose.    History of gestational hypertension Assessment & Plan: Blood pressure doing well on no medication. Follow.    Weight loss counseling, encounter for Assessment & Plan: Discussed diet and exercise. Discussed prescription medications. Also discussed referral to healthy weight and wellness. Interested in GLP 1 agonist. Discussed possible side effects and contraindications. Discussed the need to stay hydrated and importance of getting adequate protein. Elected to start wegovy - start .25mg  weekly. Follow.    Other orders -     Wegovy; Inject 0.25 mg into the skin once a week.  Dispense: 2 mL; Refill: 2 -     buPROPion  HCl ER (XL); Take 1 tablet (150 mg total) by  mouth daily.  Dispense: 30 tablet; Refill: 2     Allena Hamilton, MD

## 2024-05-19 ENCOUNTER — Encounter: Payer: Self-pay | Admitting: Internal Medicine

## 2024-05-19 DIAGNOSIS — Z713 Dietary counseling and surveillance: Secondary | ICD-10-CM | POA: Insufficient documentation

## 2024-05-19 NOTE — Assessment & Plan Note (Signed)
Blood pressure doing well on no medication.  Follow.   

## 2024-05-19 NOTE — Assessment & Plan Note (Signed)
 Was on zoloft . Tapered off. On wellbutrin  now. Tolerating. Increased work stress. Does not feel needs any further intervention at this time. Follow. Continue wellbutrin  at current dose.

## 2024-05-19 NOTE — Assessment & Plan Note (Signed)
 Discussed diet and exercise. Discussed prescription medications. Also discussed referral to healthy weight and wellness. Interested in GLP 1 agonist. Discussed possible side effects and contraindications. Discussed the need to stay hydrated and importance of getting adequate protein. Elected to start wegovy - start .25mg  weekly. Follow.

## 2024-05-27 ENCOUNTER — Encounter: Payer: Self-pay | Admitting: Internal Medicine

## 2024-05-28 ENCOUNTER — Other Ambulatory Visit: Payer: Self-pay

## 2024-08-26 ENCOUNTER — Ambulatory Visit: Admitting: Internal Medicine

## 2024-09-11 ENCOUNTER — Other Ambulatory Visit (INDEPENDENT_AMBULATORY_CARE_PROVIDER_SITE_OTHER)

## 2024-09-11 ENCOUNTER — Ambulatory Visit: Admitting: *Deleted

## 2024-09-11 VITALS — BP 143/85 | HR 80 | Wt 230.0 lb

## 2024-09-11 DIAGNOSIS — O3680X0 Pregnancy with inconclusive fetal viability, not applicable or unspecified: Secondary | ICD-10-CM

## 2024-09-11 DIAGNOSIS — Z6834 Body mass index (BMI) 34.0-34.9, adult: Secondary | ICD-10-CM

## 2024-09-11 DIAGNOSIS — Z3480 Encounter for supervision of other normal pregnancy, unspecified trimester: Secondary | ICD-10-CM | POA: Insufficient documentation

## 2024-09-11 DIAGNOSIS — Z3481 Encounter for supervision of other normal pregnancy, first trimester: Secondary | ICD-10-CM

## 2024-09-11 DIAGNOSIS — Z3A01 Less than 8 weeks gestation of pregnancy: Secondary | ICD-10-CM | POA: Diagnosis not present

## 2024-09-11 NOTE — Progress Notes (Signed)
 [name] Intake  I explained I am completing [name] Intake today. We discussed EDD of 04/24/2025, by Last Menstrual Period. Pt is G2P1001. I reviewed her allergies, medications and Medical/Surgical/OB history.    Patient Active Problem List   Diagnosis Date Noted   Supervision of other normal pregnancy, antepartum 09/11/2024   History of gestational hypertension 08/27/2022   BMI 34.0-34.9,adult 03/29/2022    Concerns addressed today  Patient informed that the ultrasound is considered a limited obstetric ultrasound and is not intended to be a complete ultrasound exam.  Patient also informed that the ultrasound is not being completed with the intent of assessing for fetal or placental anomalies or any pelvic abnormalities. Explained that the purpose of today's ultrasound is to assess for viability.  Patient acknowledges the purpose of the exam and the limitations of the study.     Delivery Plans Plans to deliver at [facility]. Discussed the nature of our practice with multiple providers including residents and students. Due to the size of the practice, the delivering provider may not be the same as those providing prenatal care.   MyChart/Babyscripts MyChart access verified. I explained pt will have some visits in office and some virtually. Babyscripts app discussed and ordered.   Blood Pressure Cuff Blood pressure cuff discussed and has one from previous pregnancy.Discussed to be used for virtual visits and or if needed BP checks weekly.  Anatomy US  Explained first scheduled US  will be around 19 weeks.   Last Pap Diagnosis  Date Value Ref Range Status  11/22/2021   Final   - Negative for intraepithelial lesion or malignancy (NILM)    First visit review I reviewed [name] appt with patient. Explained pt will be seen by Dr Fredirick at first visit. Discussed [place] genetic screening with patient and will get panorama drawn at [facility]. Routine prenatal labs to be collected at [facility].    Wanda Buckles, RN 09/11/2024  9:38 AM

## 2024-10-01 ENCOUNTER — Encounter: Admitting: Family Medicine

## 2024-10-14 ENCOUNTER — Encounter: Admitting: Internal Medicine
